# Patient Record
Sex: Male | Born: 1970 | Race: White | Hispanic: No | Marital: Married | Smoking: Never smoker
Health system: Southern US, Community
[De-identification: ages and names within clinical notes are randomized; demographics above are authoritative.]

## PROBLEM LIST (undated history)

## (undated) DIAGNOSIS — M1990 Unspecified osteoarthritis, unspecified site: Secondary | ICD-10-CM

## (undated) DIAGNOSIS — G43909 Migraine, unspecified, not intractable, without status migrainosus: Secondary | ICD-10-CM

## (undated) DIAGNOSIS — Z86018 Personal history of other benign neoplasm: Principal | ICD-10-CM

## (undated) DIAGNOSIS — I10 Essential (primary) hypertension: Secondary | ICD-10-CM

## (undated) DIAGNOSIS — K219 Gastro-esophageal reflux disease without esophagitis: Secondary | ICD-10-CM

## (undated) DIAGNOSIS — L409 Psoriasis, unspecified: Secondary | ICD-10-CM

## (undated) DIAGNOSIS — G4730 Sleep apnea, unspecified: Secondary | ICD-10-CM

## (undated) DIAGNOSIS — E119 Type 2 diabetes mellitus without complications: Secondary | ICD-10-CM

## (undated) DIAGNOSIS — G709 Myoneural disorder, unspecified: Secondary | ICD-10-CM

## (undated) HISTORY — PX: FOOT SURGERY: SHX648

## (undated) HISTORY — PX: WISDOM TOOTH EXTRACTION: SHX21

## (undated) HISTORY — PX: TONSILLECTOMY: SUR1361

## (undated) HISTORY — DX: Personal history of other benign neoplasm: Z86.018

---

## 2007-04-09 ENCOUNTER — Ambulatory Visit: Payer: Self-pay | Admitting: Family Medicine

## 2011-03-15 DIAGNOSIS — K219 Gastro-esophageal reflux disease without esophagitis: Secondary | ICD-10-CM | POA: Insufficient documentation

## 2011-03-15 DIAGNOSIS — J302 Other seasonal allergic rhinitis: Secondary | ICD-10-CM | POA: Insufficient documentation

## 2011-03-15 DIAGNOSIS — N200 Calculus of kidney: Secondary | ICD-10-CM | POA: Insufficient documentation

## 2011-03-15 DIAGNOSIS — R011 Cardiac murmur, unspecified: Secondary | ICD-10-CM | POA: Insufficient documentation

## 2011-03-15 DIAGNOSIS — E669 Obesity, unspecified: Secondary | ICD-10-CM | POA: Insufficient documentation

## 2011-03-15 DIAGNOSIS — G43909 Migraine, unspecified, not intractable, without status migrainosus: Secondary | ICD-10-CM | POA: Insufficient documentation

## 2011-05-25 DIAGNOSIS — G4733 Obstructive sleep apnea (adult) (pediatric): Secondary | ICD-10-CM | POA: Insufficient documentation

## 2011-05-25 DIAGNOSIS — R7303 Prediabetes: Secondary | ICD-10-CM | POA: Insufficient documentation

## 2013-03-20 ENCOUNTER — Emergency Department: Payer: Self-pay | Admitting: Emergency Medicine

## 2013-03-20 LAB — COMPREHENSIVE METABOLIC PANEL
Albumin: 4 g/dL (ref 3.4–5.0)
Alkaline Phosphatase: 99 U/L
Anion Gap: 1 — ABNORMAL LOW (ref 7–16)
BUN: 21 mg/dL — ABNORMAL HIGH (ref 7–18)
Bilirubin,Total: 0.3 mg/dL (ref 0.2–1.0)
CO2: 30 mmol/L (ref 21–32)
CREATININE: 1 mg/dL (ref 0.60–1.30)
Calcium, Total: 9.2 mg/dL (ref 8.5–10.1)
Chloride: 107 mmol/L (ref 98–107)
EGFR (African American): >60
Glucose: 94 mg/dL (ref 65–99)
Osmolality: 278 (ref 275–301)
Potassium: 3.8 mmol/L (ref 3.5–5.1)
SGOT(AST): 31 U/L (ref 15–37)
SGPT (ALT): 60 U/L (ref 12–78)
Sodium: 138 mmol/L (ref 136–145)
TOTAL PROTEIN: 7.8 g/dL (ref 6.4–8.2)

## 2013-03-20 LAB — CBC
HCT: 47.3 % (ref 40.0–52.0)
HGB: 15.9 g/dL (ref 13.0–18.0)
MCH: 29.6 pg (ref 26.0–34.0)
MCHC: 33.5 g/dL (ref 32.0–36.0)
MCV: 88 fL (ref 80–100)
Platelet: 270 10*3/uL (ref 150–440)
RBC: 5.35 10*6/uL (ref 4.40–5.90)
RDW: 13 % (ref 11.5–14.5)
WBC: 9.2 10*3/uL (ref 3.8–10.6)

## 2013-03-20 LAB — URINALYSIS, COMPLETE
BLOOD: NEGATIVE
Bacteria: NONE SEEN
Bilirubin,UR: NEGATIVE
GLUCOSE, UR: NEGATIVE mg/dL (ref 0–75)
Ketone: NEGATIVE
NITRITE: NEGATIVE
PH: 5 (ref 4.5–8.0)
PROTEIN: NEGATIVE
Specific Gravity: 1.025 (ref 1.003–1.030)

## 2013-10-25 ENCOUNTER — Encounter: Payer: Self-pay | Admitting: Podiatry

## 2013-10-25 ENCOUNTER — Ambulatory Visit: Payer: BC Managed Care – PPO | Admitting: Podiatry

## 2013-10-25 ENCOUNTER — Ambulatory Visit (INDEPENDENT_AMBULATORY_CARE_PROVIDER_SITE_OTHER): Payer: BC Managed Care – PPO | Admitting: Podiatry

## 2013-10-25 ENCOUNTER — Other Ambulatory Visit: Payer: Self-pay | Admitting: *Deleted

## 2013-10-25 ENCOUNTER — Ambulatory Visit (INDEPENDENT_AMBULATORY_CARE_PROVIDER_SITE_OTHER): Payer: BC Managed Care – PPO

## 2013-10-25 ENCOUNTER — Encounter: Payer: Self-pay | Admitting: *Deleted

## 2013-10-25 VITALS — BP 137/78 | HR 50 | Resp 16

## 2013-10-25 DIAGNOSIS — M779 Enthesopathy, unspecified: Secondary | ICD-10-CM

## 2013-10-25 MED ORDER — TRIAMCINOLONE ACETONIDE 10 MG/ML IJ SUSP
10.0000 mg | Freq: Once | INTRAMUSCULAR | Status: DC
Start: 2013-10-25 — End: 2023-11-17

## 2013-10-25 NOTE — Progress Notes (Signed)
Subjective:     Patient ID: Keith Osborne, male   DOB: Jan 16, 1971, 43 y.o.   MRN: [id number]  HPI patient states that I get some low grade pain in the dorsal lateral aspect of my left foot and the boot seem to help me that I wear based on your notes   Review of Systems     Objective:   Physical Exam Neurovascular status intact with muscle strength adequate and found to have discomfort dorsal lateral aspect left foot of a mild nature with no current heel pain noted left    Assessment:     Moderate tendinitis-like symptoms dorsal lateral left foot with improvement significant from endoscopic fascial surgery    Plan:     H&P and x-rays reviewed. Injected the dorsal lateral foot 3 mg Kenalog 5 mg Xylocaine and instructed on physical therapy and reappoint as needed

## 2013-11-05 ENCOUNTER — Ambulatory Visit: Payer: Self-pay | Admitting: Podiatry

## 2014-08-29 ENCOUNTER — Other Ambulatory Visit: Payer: Self-pay | Admitting: Podiatry

## 2014-08-29 DIAGNOSIS — M79672 Pain in left foot: Secondary | ICD-10-CM

## 2014-09-04 ENCOUNTER — Ambulatory Visit
Admission: RE | Admit: 2014-09-04 | Discharge: 2014-09-04 | Disposition: A | Discharge status: Home / Self Care | Payer: BC Managed Care – PPO | Source: Ambulatory Visit | Attending: Podiatry | Admitting: Podiatry

## 2014-09-04 DIAGNOSIS — R2242 Localized swelling, mass and lump, left lower limb: Secondary | ICD-10-CM | POA: Insufficient documentation

## 2014-09-04 DIAGNOSIS — M79672 Pain in left foot: Secondary | ICD-10-CM | POA: Insufficient documentation

## 2015-01-08 IMAGING — CT CT ABD-PELV W/ CM
2 of 5 series · 17 of 46 positions shown, 19 images · IV contrast (isovue)
Comparison: None.

CLINICAL DATA: Right flank pain

EXAM:
CT ABDOMEN AND PELVIS WITH CONTRAST
TECHNIQUE: Multidetector CT imaging of the abdomen and pelvis was performed
using the standard protocol following bolus administration of
intravenous contrast.
CONTRAST:  125 mL Isovue 370 IV

[Series 2: routine abd pel with · axial · 0.91mm/px · z∈[-1200,-774]mm · 14 of 95 slices shown, 16 images]
[im 5/95  soft-tissue]
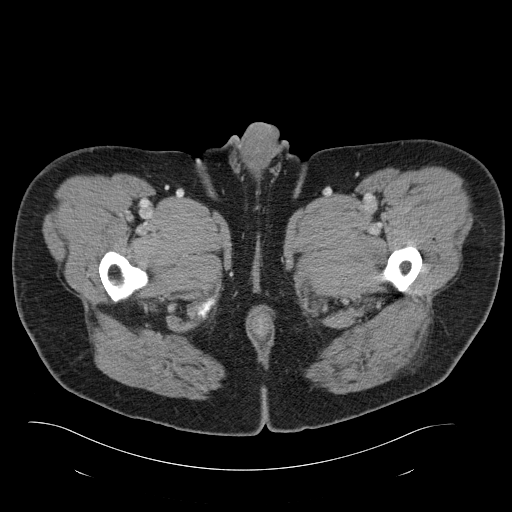
[im 5/95  bone]
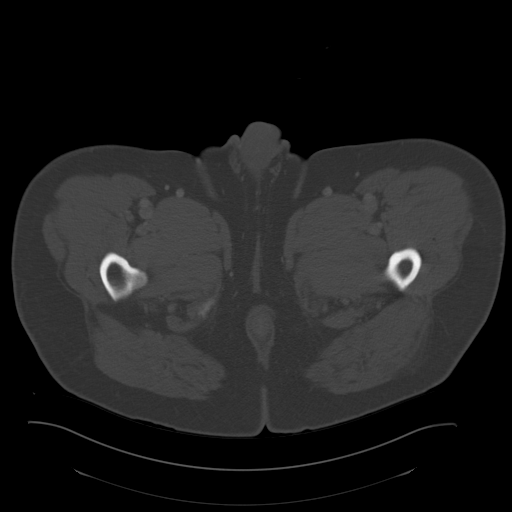
[im 10/95  soft-tissue]
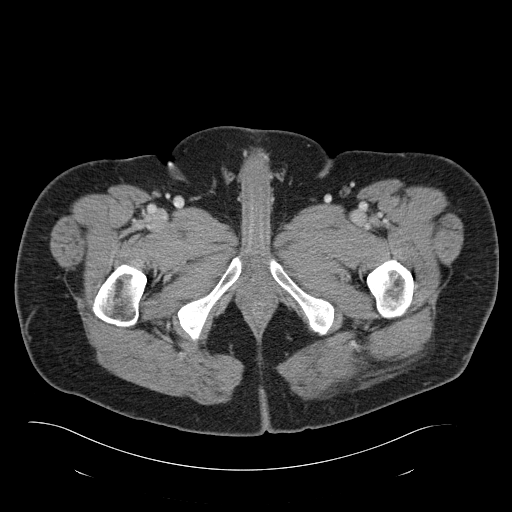
[im 20/95  soft-tissue]
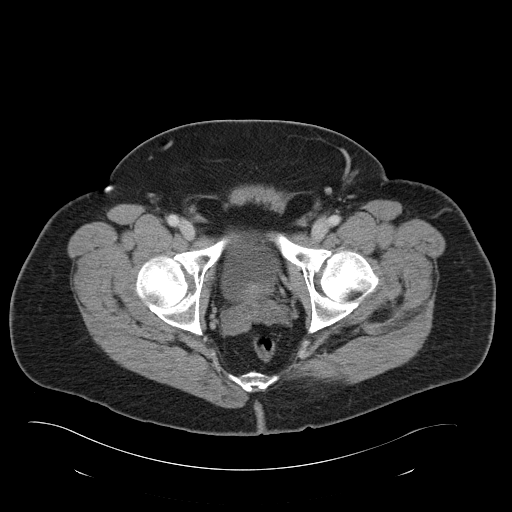
[im 25/95  soft-tissue]
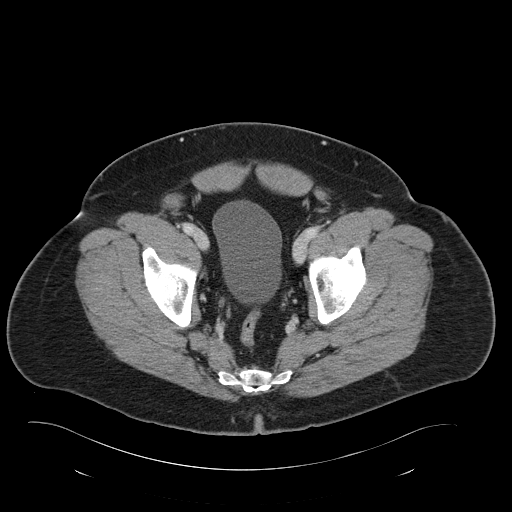
[im 30/95  soft-tissue]
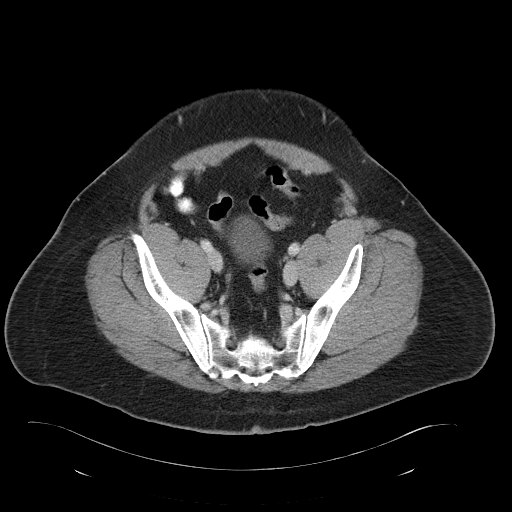
[im 40/95  soft-tissue]
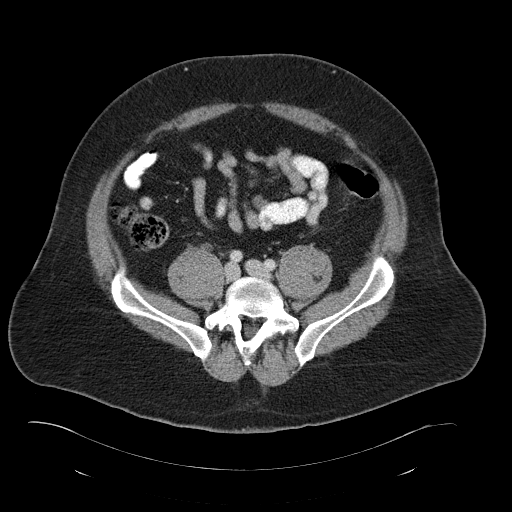
[im 45/95  soft-tissue]
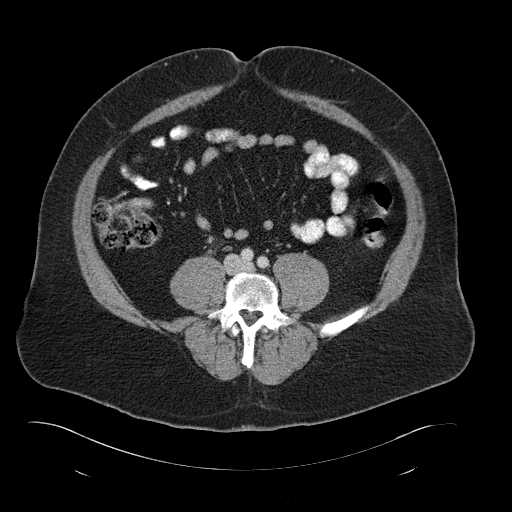
[im 50/95  soft-tissue]
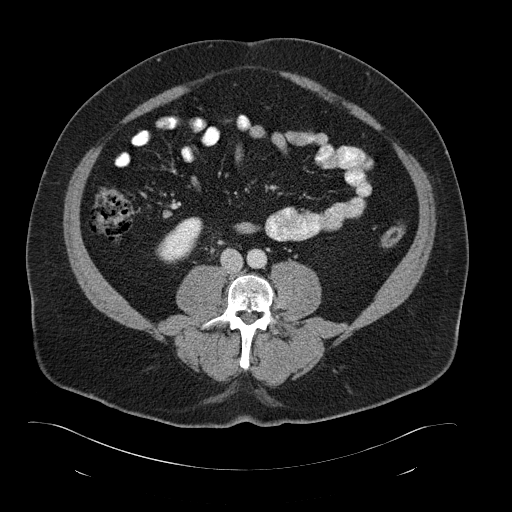
[im 55/95  soft-tissue]
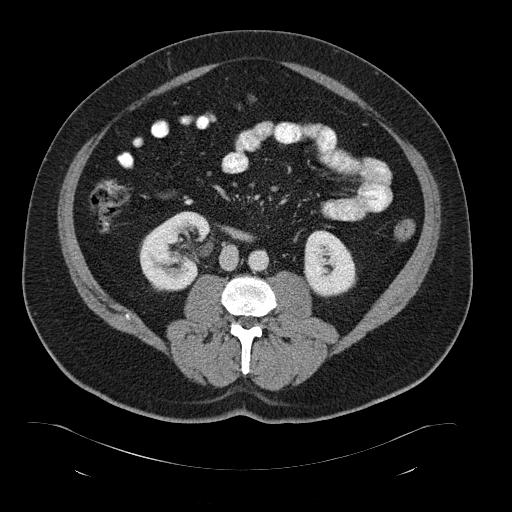
[im 55/95  bone]
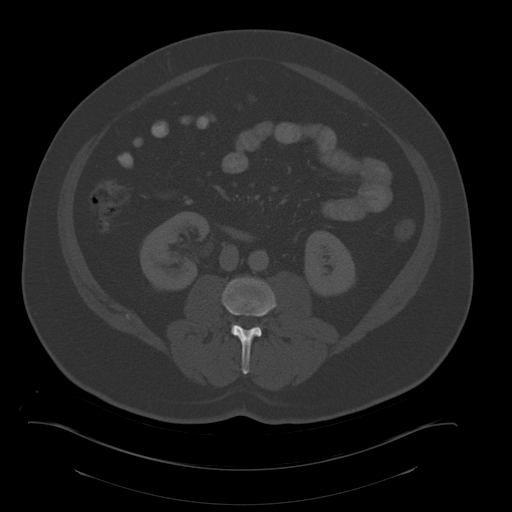
[im 65/95  soft-tissue]
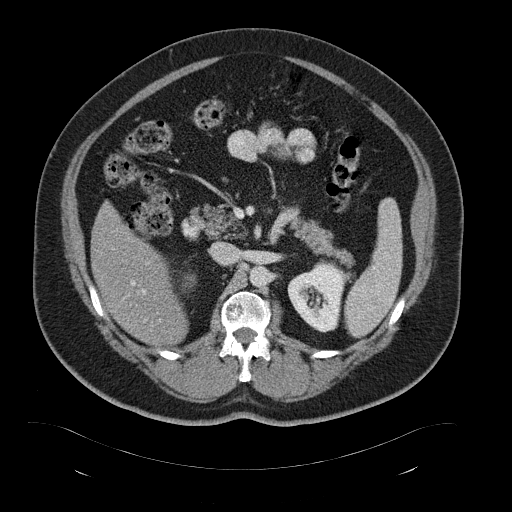
[im 70/95  soft-tissue]
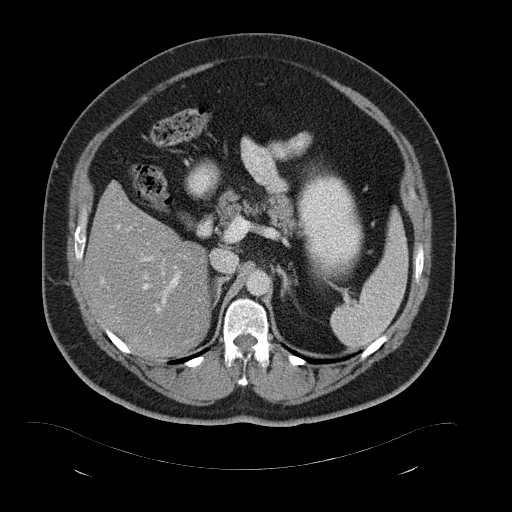
[im 75/95  soft-tissue]
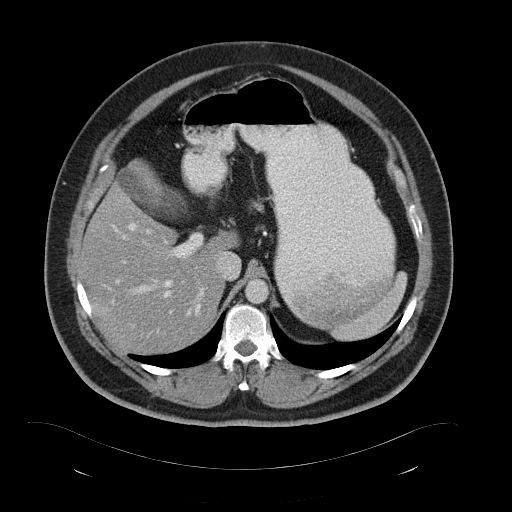
[im 85/95  soft-tissue]
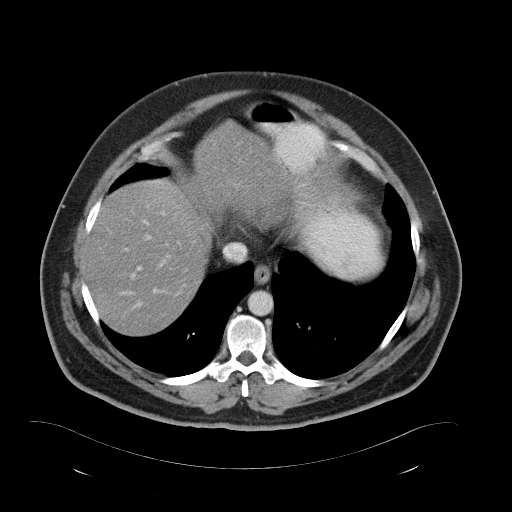
[im 90/95  soft-tissue]
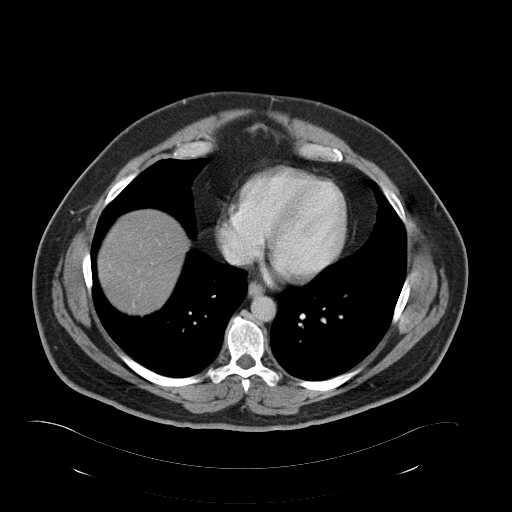

[Series 5: cor routine abd pel with · coronal · 0.91mm/px · 3 of 199 slices shown]
[im 67/199  soft-tissue]
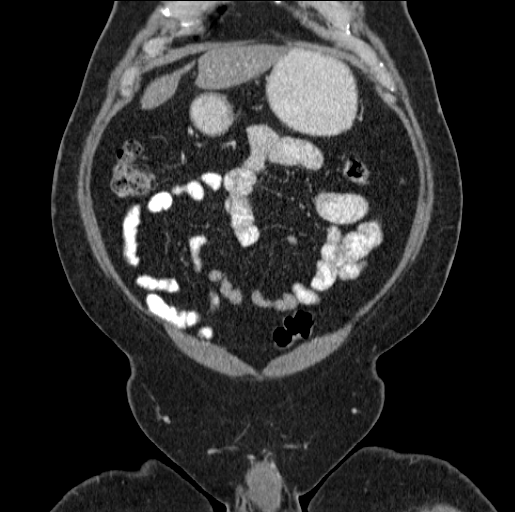
[im 89/199  soft-tissue]
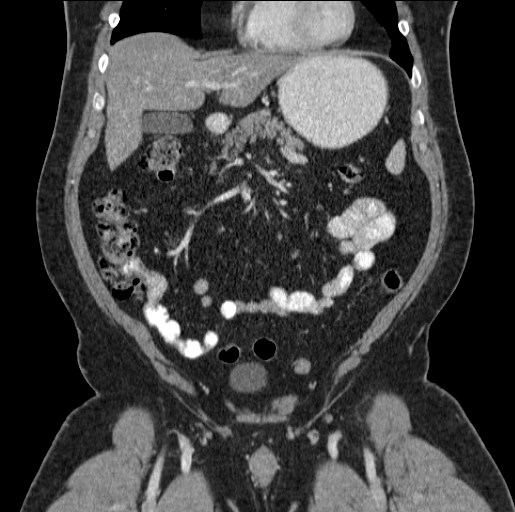
[im 111/199  soft-tissue]
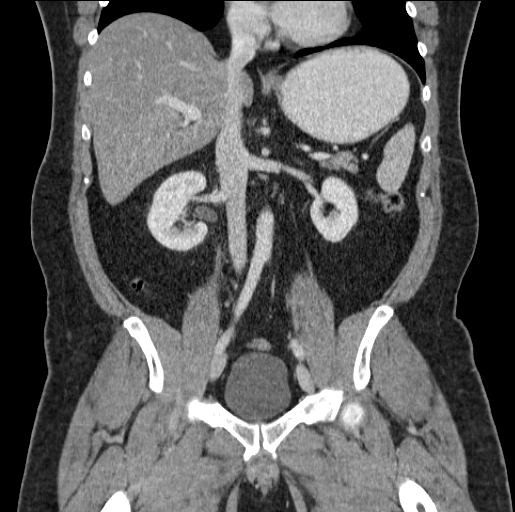

[17 of 46 positions shown; findings below may reference images not displayed]

FINDINGS: Visualized lung bases clear. Fatty liver without focal lesion.
Unremarkable nondistended gallbladder, spleen, adrenal glands,
kidneys, pancreas, aorta, portal vein, stomach, small bowel,
appendix, colon. No nephrolithiasis. Urinary bladder physiologically
distended. Moderate prostatic enlargement. No ascites. No free air.
No adenopathy. No hydronephrosis. Degenerative disc disease L4-5.
IMPRESSION: 1. No acute abdominal process.  Normal appendix.
2. Fatty liver

## 2016-01-21 ENCOUNTER — Encounter: Payer: Self-pay | Admitting: Emergency Medicine

## 2016-01-21 ENCOUNTER — Emergency Department
Admission: EM | Admit: 2016-01-21 | Discharge: 2016-01-21 | Disposition: A | Discharge status: Home / Self Care | Payer: BC Managed Care – PPO | Attending: Emergency Medicine | Admitting: Emergency Medicine

## 2016-01-21 DIAGNOSIS — Z7982 Long term (current) use of aspirin: Secondary | ICD-10-CM | POA: Diagnosis not present

## 2016-01-21 DIAGNOSIS — N200 Calculus of kidney: Secondary | ICD-10-CM

## 2016-01-21 DIAGNOSIS — Z79899 Other long term (current) drug therapy: Secondary | ICD-10-CM | POA: Insufficient documentation

## 2016-01-21 DIAGNOSIS — R109 Unspecified abdominal pain: Secondary | ICD-10-CM

## 2016-01-21 DIAGNOSIS — R1030 Lower abdominal pain, unspecified: Secondary | ICD-10-CM | POA: Diagnosis present

## 2016-01-21 HISTORY — DX: Migraine, unspecified, not intractable, without status migrainosus: G43.909

## 2016-01-21 HISTORY — DX: Gastro-esophageal reflux disease without esophagitis: K21.9

## 2016-01-21 LAB — BASIC METABOLIC PANEL
Anion gap: 8 (ref 5–15)
BUN: 21 mg/dL — AB (ref 6–20)
CHLORIDE: 106 mmol/L (ref 101–111)
CO2: 25 mmol/L (ref 22–32)
CREATININE: 1.03 mg/dL (ref 0.61–1.24)
Calcium: 9.4 mg/dL (ref 8.9–10.3)
GFR calc Af Amer: >60 mL/min (ref >60)
GFR calc non Af Amer: >60 mL/min (ref >60)
GLUCOSE: 107 mg/dL — AB (ref 65–99)
POTASSIUM: 3.9 mmol/L (ref 3.5–5.1)
Sodium: 139 mmol/L (ref 135–145)

## 2016-01-21 LAB — CBC
HCT: 47.1 % (ref 40.0–52.0)
Hemoglobin: 16.2 g/dL (ref 13.0–18.0)
MCH: 29.7 pg (ref 26.0–34.0)
MCHC: 34.4 g/dL (ref 32.0–36.0)
MCV: 86.2 fL (ref 80.0–100.0)
Platelets: 237 10*3/uL (ref 150–440)
RBC: 5.47 MIL/uL (ref 4.40–5.90)
RDW: 13.3 % (ref 11.5–14.5)
WBC: 7.8 10*3/uL (ref 3.8–10.6)

## 2016-01-21 LAB — URINALYSIS, COMPLETE (UACMP) WITH MICROSCOPIC
BILIRUBIN URINE: NEGATIVE
Bacteria, UA: NONE SEEN
GLUCOSE, UA: NEGATIVE mg/dL
Ketones, ur: NEGATIVE mg/dL
LEUKOCYTES UA: NEGATIVE
Nitrite: NEGATIVE
PH: 6 (ref 5.0–8.0)
Protein, ur: 30 mg/dL — AB
SPECIFIC GRAVITY, URINE: 1.026 (ref 1.005–1.030)

## 2016-01-21 MED ORDER — ONDANSETRON HCL 4 MG/2ML IJ SOLN
4.0000 mg | Freq: Once | INTRAMUSCULAR | Status: AC
  Administered 2016-01-21: 4 mg via INTRAVENOUS
  Filled 2016-01-21: qty 2

## 2016-01-21 MED ORDER — SODIUM CHLORIDE 0.9 % IV BOLUS (SEPSIS)
1000.0000 mL | Freq: Once | INTRAVENOUS | Status: AC
  Administered 2016-01-21: 1000 mL via INTRAVENOUS

## 2016-01-21 MED ORDER — OXYCODONE-ACETAMINOPHEN 5-325 MG PO TABS
ORAL_TABLET | ORAL | Status: AC
  Filled 2016-01-21: qty 1

## 2016-01-21 MED ORDER — KETOROLAC TROMETHAMINE 30 MG/ML IJ SOLN
30.0000 mg | Freq: Once | INTRAMUSCULAR | Status: AC
  Administered 2016-01-21: 30 mg via INTRAVENOUS
  Filled 2016-01-21: qty 1

## 2016-01-21 MED ORDER — TAMSULOSIN HCL 0.4 MG PO CAPS: 0.4000 mg | ORAL_CAPSULE | Freq: Every day | ORAL | 0 refills | Status: AC

## 2016-01-21 MED ORDER — MORPHINE SULFATE (PF) 4 MG/ML IV SOLN
4.0000 mg | Freq: Once | INTRAVENOUS | Status: AC
  Administered 2016-01-21: 4 mg via INTRAVENOUS
  Filled 2016-01-21: qty 1

## 2016-01-21 MED ORDER — OXYCODONE-ACETAMINOPHEN 5-325 MG PO TABS
1.0000 | ORAL_TABLET | Freq: Once | ORAL | Status: AC
  Administered 2016-01-21: 1 via ORAL
  Filled 2016-01-21: qty 1

## 2016-01-21 MED ORDER — IBUPROFEN 200 MG PO TABS: 600.0000 mg | ORAL_TABLET | Freq: Four times a day (QID) | ORAL | 0 refills | Status: DC | PRN

## 2016-01-21 MED ORDER — ONDANSETRON HCL 4 MG PO TABS: 4.0000 mg | ORAL_TABLET | Freq: Three times a day (TID) | ORAL | 0 refills | Status: DC | PRN

## 2016-01-21 MED ORDER — OXYCODONE-ACETAMINOPHEN 5-325 MG PO TABS: 1.0000 | ORAL_TABLET | Freq: Four times a day (QID) | ORAL | 0 refills | Status: DC | PRN

## 2016-01-21 MED ORDER — OXYCODONE-ACETAMINOPHEN 5-325 MG PO TABS
1.0000 | ORAL_TABLET | Freq: Once | ORAL | Status: AC
  Administered 2016-01-21: 1 via ORAL

## 2016-01-21 NOTE — Discharge Instructions (Signed)
At this time, as we discussed, kidney stones the most likely diagnosis however, we cannot prove that without a CT scan. However, CT scan does involve a lot of radiation as we discussed.. If you have fever, increased pain, numbness, weakness, vomiting, or any other concerning symptoms return to the emergency room. Follow closely with her primary care doctor and her urologist.

## 2016-01-21 NOTE — ED Triage Notes (Signed)
Pt to ED c/o left flank pain and groin pain this morning.  States difficulty urinating, nausea and vomiting x2.  Hx of kidney stones and feels similar.

## 2016-01-21 NOTE — ED Provider Notes (Addendum)
[facility] Emergency Department Provider Note  ____________________________________________   I have reviewed the triage vital signs and the nursing notes.   HISTORY  Chief Complaint Flank Pain and Groin Pain    HPI Keith Osborne is a 45 y.o. male who presents today complaining of a kidney stone. Patient has had multiple different kidney stones in the past. He had sudden onset right flank pain radiating to the groin started this morning. Very similar to prior kidney stones. Has had at least one CT scan past for these. There had a complications that he recalls. States that he would prefer to avoid CT if possible. He'll like to have the pain reduced. Pain is sharp and colicky, goes from the left flank.     Past Medical History:  Diagnosis Date  . Acid reflux   . Migraines     Patient Active Problem List   Diagnosis Date Noted  . Obstructive apnea 05/25/2011  . Borderline diabetes 05/25/2011  . Acid reflux 03/15/2011  . Cardiac murmur 03/15/2011  . Headache, migraine 03/15/2011  . Calculus of kidney 03/15/2011  . Adiposity 03/15/2011  . Allergic rhinitis, seasonal 03/15/2011    Past Surgical History:  Procedure Laterality Date  . FOOT SURGERY Left   . WISDOM TOOTH EXTRACTION      Prior to Admission medications   Medication Sig Start Date End Date Taking? Authorizing Provider  aspirin-acetaminophen-caffeine (MIGRAINE FORMULA) [phone] MG per tablet As directed by package.    Historical Provider, MD  cetirizine-pseudoephedrine (ZYRTEC-D) 5-120 MG per tablet Take by mouth.    Historical Provider, MD  esomeprazole (NEXIUM) 40 MG capsule Take 40 mg by mouth daily at 12 noon.    Historical Provider, MD  propranolol (INDERAL) 40 MG tablet Take by mouth. 05/01/13   Historical Provider, MD    Allergies Cyclobenzaprine  History reviewed. No pertinent family history.  Social History Social History  Substance Use Topics  . Smoking status:  Never Smoker  . Smokeless tobacco: Never Used  . Alcohol use Yes     Comment: occ.    Review of Systems Constitutional: No fever/chills Eyes: No visual changes. ENT: No sore throat. No stiff neck no neck pain Cardiovascular: Denies chest pain. Respiratory: Denies shortness of breath. Gastrointestinal:   no vomiting.  No diarrhea.  No constipation. Genitourinary: Negative for dysuria. Musculoskeletal: Negative lower extremity swelling Skin: Negative for rash. Neurological: Negative for severe headaches, focal weakness or numbness. 10-point ROS otherwise negative.  ____________________________________________   PHYSICAL EXAM:  VITAL SIGNS: ED Triage Vitals  Enc Vitals Group     BP 01/21/16 1135 (!) 158/87     Pulse Rate 01/21/16 1135 (!) 52     Resp 01/21/16 1135 18     Temp 01/21/16 1135 97.5 F (36.4 C)     Temp Source 01/21/16 1135 Oral     SpO2 01/21/16 1135 100 %     Weight 01/21/16 1135 245 lb (111.1 kg)     Height 01/21/16 1135 5\' 5"  (1.651 m)     Head Circumference --      Peak Flow --      Pain Score 01/21/16 1136 10     Pain Loc --      Pain Edu? --      Excl. in [place]? --     Constitutional: Alert and oriented. Well appearing and in no acute distress. Eyes: Conjunctivae are normal. PERRL. EOMI. Head: Atraumatic. Nose: No congestion/rhinnorhea. Mouth/Throat: Mucous membranes are moist.  Oropharynx non-erythematous. Neck: No stridor.   Nontender with no meningismus Cardiovascular: Normal rate, regular rhythm. Grossly normal heart sounds.  Good peripheral circulation. Respiratory: Normal respiratory effort.  No retractions. Lungs CTAB. Abdominal: Soft and nontender. No distention. No guarding no rebound GU normal male genitalia no tenderness no mass, no lesions noted Back:  There is no focal tenderness or step off.  there is no midline tenderness there are no lesions noted. there is Left CVA tenderness Musculoskeletal: No lower extremity tenderness, no upper  extremity tenderness. No joint effusions, no DVT signs strong distal pulses no edema Neurologic:  Normal speech and language. No gross focal neurologic deficits are appreciated.  Skin:  Skin is warm, dry and intact. No rash noted. Psychiatric: Mood and affect are normal. Speech and behavior are normal.  ____________________________________________   LABS (all labs ordered are listed, but only abnormal results are displayed)  Labs Reviewed  URINALYSIS, COMPLETE (UACMP) WITH MICROSCOPIC - Abnormal; Notable for the following:       Result Value   Color, Urine YELLOW (*)    APPearance CLEAR (*)    Hgb urine dipstick LARGE (*)    Protein, ur 30 (*)    Squamous Epithelial / LPF 0-5 (*)    All other components within normal limits  BASIC METABOLIC PANEL - Abnormal; Notable for the following:    Glucose, Bld 107 (*)    BUN 21 (*)    All other components within normal limits  CBC   ____________________________________________  EKG  I personally interpreted any EKGs ordered by me or triage  ____________________________________________  RADIOLOGY  I reviewed any imaging ordered by me or triage that were performed during my shift and, if possible, patient and/or family made aware of any abnormal findings. ____________________________________________   PROCEDURES  Procedure(s) performed: None  Procedures  Critical Care performed: None  ____________________________________________   INITIAL IMPRESSION / ASSESSMENT AND PLAN / ED COURSE  Pertinent labs & imaging results that were available during my care of the patient were reviewed by me and considered in my medical decision making (see chart for details).  Appearing gentleman with a history of kidney stones presents today with what appears to be a kidney stone with hematuria, sudden onset flank pain. Do not at this time suspect AAA or other intra-abdominal pathology of any significance. Patient understands that CT scan, there  is a limited to what I can evaluate him for, however, we are both very reassured by the history and our findings. Patient would therefore prefer to decline CT scan. He knows it is available to him if he changes his mind. He was as risk benefits alternatives of declining ECT. Accordingly, we will treat the patient with appropriate analgesia and antiemetics, if we can get his pain better, is my hope that we get him safely home with close outpatient follow-up with urology. Return precautions and follow-up given and understood.  ----------------------------------------- 2:59 PM on 01/21/2016 -----------------------------------------  Patient pain-free able to urinate, declines further workup eager to go home. Has a urologist. Burnis Medin follow-up. Return precautions and follow-up given and understood. Abdomen remains completely benign there is nothing this time to suggest AAA, diverticulitis, appendicitis etc. Patient also does not have any evidence to support the idea that this is a referred intrathoracic pathology.  Clinical Course    ____________________________________________   FINAL CLINICAL IMPRESSION(S) / ED DIAGNOSES  Final diagnoses:  None      This chart was dictated using voice recognition software.  Despite best efforts  to proofread,  errors can occur which can change meaning.      Schuyler Amor, MD 01/21/16 1241    Schuyler Amor, MD 01/21/16 [place], MD 01/21/16 [phone]

## 2016-05-25 DIAGNOSIS — Z86018 Personal history of other benign neoplasm: Secondary | ICD-10-CM

## 2016-05-25 HISTORY — DX: Personal history of other benign neoplasm: Z86.018

## 2018-02-20 ENCOUNTER — Other Ambulatory Visit: Payer: Self-pay | Admitting: Podiatry

## 2018-03-05 NOTE — Anesthesia Preprocedure Evaluation (Addendum)
Anesthesia Evaluation  Patient identified by MRN, date of birth, ID band Patient awake    Reviewed: Allergy & Precautions, NPO status , Patient's Chart, lab work & pertinent test results  History of Anesthesia Complications Negative for: history of anesthetic complications  Airway Mallampati: IV   Neck ROM: Full    Dental no notable dental hx.    Pulmonary sleep apnea and Continuous Positive Airway Pressure Ventilation ,    Pulmonary exam normal breath sounds clear to auscultation       Cardiovascular hypertension, Normal cardiovascular exam Rhythm:Regular Rate:Normal  Stress echo 12/26/17:  Normal Stress Echocardiogram VERY MILD TR NORAML ESTIMATED PA PRESSURE NORMAL RESTING LV AND RV FUNCTION VALVES NORMAL NO PERICARDIAL EFFUSION   Neuro/Psych  Headaches,    GI/Hepatic GERD  ,  Endo/Other  Obesity   Renal/GU Renal disease (hx nephrolithiasis)     Musculoskeletal   Abdominal   Peds  Hematology negative hematology ROS (+)   Anesthesia Other Findings   Reproductive/Obstetrics                            Anesthesia Physical Anesthesia Plan  ASA: III  Anesthesia Plan: General   Post-op Pain Management:    Induction: Intravenous  PONV Risk Score and Plan: 2 and Dexamethasone and Ondansetron  Airway Management Planned: LMA  Additional Equipment:   Intra-op Plan:   Post-operative Plan: Extubation in OR  Informed Consent: I have reviewed the patients History and Physical, chart, labs and discussed the procedure including the risks, benefits and alternatives for the proposed anesthesia with the patient or authorized representative who has indicated his/her understanding and acceptance.       Plan Discussed with: CRNA  Anesthesia Plan Comments:        Anesthesia Quick Evaluation

## 2018-03-07 ENCOUNTER — Ambulatory Visit: Payer: BC Managed Care – PPO | Admitting: Anesthesiology

## 2018-03-07 ENCOUNTER — Ambulatory Visit
Admission: RE | Admit: 2018-03-07 | Discharge: 2018-03-07 | Disposition: A | Discharge status: Home / Self Care | Payer: BC Managed Care – PPO | Attending: Podiatry | Admitting: Podiatry

## 2018-03-07 ENCOUNTER — Encounter
Admission: RE | Disposition: A | Discharge status: Home / Self Care | Payer: Self-pay | Source: Home / Self Care | Attending: Podiatry

## 2018-03-07 DIAGNOSIS — Z79899 Other long term (current) drug therapy: Secondary | ICD-10-CM | POA: Insufficient documentation

## 2018-03-07 DIAGNOSIS — I10 Essential (primary) hypertension: Secondary | ICD-10-CM | POA: Diagnosis not present

## 2018-03-07 DIAGNOSIS — M722 Plantar fascial fibromatosis: Secondary | ICD-10-CM | POA: Diagnosis not present

## 2018-03-07 DIAGNOSIS — Z6841 Body Mass Index (BMI) 40.0 and over, adult: Secondary | ICD-10-CM | POA: Insufficient documentation

## 2018-03-07 DIAGNOSIS — G43909 Migraine, unspecified, not intractable, without status migrainosus: Secondary | ICD-10-CM | POA: Diagnosis not present

## 2018-03-07 DIAGNOSIS — F419 Anxiety disorder, unspecified: Secondary | ICD-10-CM | POA: Insufficient documentation

## 2018-03-07 DIAGNOSIS — Z791 Long term (current) use of non-steroidal anti-inflammatories (NSAID): Secondary | ICD-10-CM | POA: Diagnosis not present

## 2018-03-07 DIAGNOSIS — K219 Gastro-esophageal reflux disease without esophagitis: Secondary | ICD-10-CM | POA: Insufficient documentation

## 2018-03-07 DIAGNOSIS — G5751 Tarsal tunnel syndrome, right lower limb: Secondary | ICD-10-CM | POA: Insufficient documentation

## 2018-03-07 DIAGNOSIS — G4733 Obstructive sleep apnea (adult) (pediatric): Secondary | ICD-10-CM | POA: Diagnosis not present

## 2018-03-07 DIAGNOSIS — E669 Obesity, unspecified: Secondary | ICD-10-CM | POA: Diagnosis not present

## 2018-03-07 DIAGNOSIS — Z888 Allergy status to other drugs, medicaments and biological substances status: Secondary | ICD-10-CM | POA: Insufficient documentation

## 2018-03-07 DIAGNOSIS — R7303 Prediabetes: Secondary | ICD-10-CM | POA: Insufficient documentation

## 2018-03-07 DIAGNOSIS — Z7982 Long term (current) use of aspirin: Secondary | ICD-10-CM | POA: Diagnosis not present

## 2018-03-07 HISTORY — DX: Essential (primary) hypertension: I10

## 2018-03-07 HISTORY — DX: Sleep apnea, unspecified: G47.30

## 2018-03-07 HISTORY — PX: NERVE REPAIR: SHX2083

## 2018-03-07 HISTORY — PX: PLANTAR FASCIA RELEASE: SHX2239

## 2018-03-07 SURGERY — RELEASE, FASCIA, PLANTAR
Anesthesia: General | Site: Foot | Laterality: Right

## 2018-03-07 MED ORDER — OXYCODONE-ACETAMINOPHEN 5-325 MG PO TABS: 1.0000 | ORAL_TABLET | Freq: Four times a day (QID) | ORAL | 0 refills | Status: DC | PRN

## 2018-03-07 MED ORDER — ONDANSETRON HCL 4 MG/2ML IJ SOLN
INTRAMUSCULAR | Status: DC | PRN
  Administered 2018-03-07: 4 mg via INTRAVENOUS

## 2018-03-07 MED ORDER — ONDANSETRON HCL 4 MG PO TABS: 4.0000 mg | ORAL_TABLET | Freq: Three times a day (TID) | ORAL | 1 refills | Status: AC | PRN

## 2018-03-07 MED ORDER — LIDOCAINE HCL (CARDIAC) PF 100 MG/5ML IV SOSY
PREFILLED_SYRINGE | INTRAVENOUS | Status: DC | PRN
  Administered 2018-03-07: 40 mg via INTRATRACHEAL

## 2018-03-07 MED ORDER — BUPIVACAINE LIPOSOME 1.3 % IJ SUSP
INTRAMUSCULAR | Status: DC | PRN
  Administered 2018-03-07 (×2): 10 mL

## 2018-03-07 MED ORDER — FENTANYL CITRATE (PF) 100 MCG/2ML IJ SOLN
INTRAMUSCULAR | Status: DC | PRN
  Administered 2018-03-07: 25 ug via INTRAVENOUS
  Administered 2018-03-07: 50 ug via INTRAVENOUS
  Administered 2018-03-07: 25 ug via INTRAVENOUS

## 2018-03-07 MED ORDER — LACTATED RINGERS IV SOLN: INTRAVENOUS | Status: DC

## 2018-03-07 MED ORDER — BUPIVACAINE-EPINEPHRINE (PF) 0.25% -1:200000 IJ SOLN
INTRAMUSCULAR | Status: DC | PRN
  Administered 2018-03-07: 10 mL

## 2018-03-07 MED ORDER — ACETAMINOPHEN 10 MG/ML IV SOLN: 1000.0000 mg | Freq: Once | INTRAVENOUS | Status: DC | PRN

## 2018-03-07 MED ORDER — POVIDONE-IODINE 7.5 % EX SOLN: Freq: Once | CUTANEOUS | Status: DC

## 2018-03-07 MED ORDER — OXYCODONE HCL 5 MG/5ML PO SOLN: 5.0000 mg | Freq: Once | ORAL | Status: DC | PRN

## 2018-03-07 MED ORDER — GLYCOPYRROLATE 0.2 MG/ML IJ SOLN
INTRAMUSCULAR | Status: DC | PRN
  Administered 2018-03-07: 0.1 mg via INTRAVENOUS

## 2018-03-07 MED ORDER — OXYCODONE-ACETAMINOPHEN 5-325 MG PO TABS: 1.0000 | ORAL_TABLET | ORAL | 0 refills | Status: AC | PRN

## 2018-03-07 MED ORDER — OXYCODONE HCL 5 MG PO TABS: 5.0000 mg | ORAL_TABLET | Freq: Once | ORAL | Status: DC | PRN

## 2018-03-07 MED ORDER — FENTANYL CITRATE (PF) 100 MCG/2ML IJ SOLN: 25.0000 ug | INTRAMUSCULAR | Status: DC | PRN

## 2018-03-07 MED ORDER — ONDANSETRON HCL 4 MG/2ML IJ SOLN: 4.0000 mg | Freq: Four times a day (QID) | INTRAMUSCULAR | Status: DC | PRN

## 2018-03-07 MED ORDER — DEXAMETHASONE SODIUM PHOSPHATE 4 MG/ML IJ SOLN
INTRAMUSCULAR | Status: DC | PRN
  Administered 2018-03-07: 4 mg via INTRAVENOUS

## 2018-03-07 MED ORDER — ONDANSETRON HCL 4 MG/2ML IJ SOLN: 4.0000 mg | Freq: Once | INTRAMUSCULAR | Status: DC | PRN

## 2018-03-07 MED ORDER — CEFAZOLIN SODIUM-DEXTROSE 2-4 GM/100ML-% IV SOLN
2.0000 g | INTRAVENOUS | Status: AC
  Administered 2018-03-07: 2 g via INTRAVENOUS

## 2018-03-07 MED ORDER — PROPOFOL 10 MG/ML IV BOLUS
INTRAVENOUS | Status: DC | PRN
  Administered 2018-03-07: 150 mg via INTRAVENOUS

## 2018-03-07 MED ORDER — ONDANSETRON HCL 4 MG PO TABS: 4.0000 mg | ORAL_TABLET | Freq: Four times a day (QID) | ORAL | Status: DC | PRN

## 2018-03-07 MED ORDER — LACTATED RINGERS IV SOLN
INTRAVENOUS | Status: DC
  Administered 2018-03-07: 14:00:00 via INTRAVENOUS

## 2018-03-07 MED ORDER — MIDAZOLAM HCL 5 MG/5ML IJ SOLN
INTRAMUSCULAR | Status: DC | PRN
  Administered 2018-03-07: 2 mg via INTRAVENOUS

## 2018-03-07 SURGICAL SUPPLY — 47 items
APPLICATOR COTTON TIP 6IN STRL (MISCELLANEOUS) ×7 IMPLANT
BANDAGE ELASTIC 4 LF NS (GAUZE/BANDAGES/DRESSINGS) ×2 IMPLANT
BENZOIN TINCTURE PRP APPL 2/3 (GAUZE/BANDAGES/DRESSINGS) IMPLANT
BNDG COHESIVE 4X5 TAN STRL (GAUZE/BANDAGES/DRESSINGS) ×2 IMPLANT
BNDG ESMARK 4X12 TAN STRL LF (GAUZE/BANDAGES/DRESSINGS) ×2 IMPLANT
BNDG GAUZE 4.5X4.1 6PLY STRL (MISCELLANEOUS) ×2 IMPLANT
BNDG STRETCH 4X75 STRL LF (GAUZE/BANDAGES/DRESSINGS) ×2 IMPLANT
CANISTER SUCT 1200ML W/VALVE (MISCELLANEOUS) ×2 IMPLANT
COVER LIGHT HANDLE UNIVERSAL (MISCELLANEOUS) ×4 IMPLANT
DURAPREP 26ML APPLICATOR (WOUND CARE) ×2 IMPLANT
ELECT REM PT RETURN 9FT ADLT (ELECTROSURGICAL) ×2
ELECTRODE REM PT RTRN 9FT ADLT (ELECTROSURGICAL) ×1 IMPLANT
ENDOSCOPIC PLANTAR FASCIOTOMY (KITS) ×1 IMPLANT
GAUZE PETRO XEROFOAM 1X8 (MISCELLANEOUS) ×1 IMPLANT
GAUZE PETRO XEROFOAM 5X9 (MISCELLANEOUS) ×2 IMPLANT
GAUZE SPONGE 4X4 12PLY STRL (GAUZE/BANDAGES/DRESSINGS) IMPLANT
GLOVE BIO SURGEON STRL SZ7.5 (GLOVE) ×3 IMPLANT
GLOVE INDICATOR 8.0 STRL GRN (GLOVE) ×3 IMPLANT
GOWN STRL REUS W/ TWL LRG LVL3 (GOWN DISPOSABLE) ×2 IMPLANT
GOWN STRL REUS W/TWL LRG LVL3 (GOWN DISPOSABLE) ×2
IV NS 250ML (IV SOLUTION) ×1
IV NS 250ML BAXH (IV SOLUTION) ×1 IMPLANT
K-WIRE DBL END TROCAR 6X.062 (WIRE) ×2
KIT TURNOVER KIT A (KITS) ×2 IMPLANT
KWIRE DBL END TROCAR 6X.062 (WIRE) IMPLANT
NDL 18GX1X1/2 (RX/OR ONLY) (NEEDLE) IMPLANT
NDL HYPO 25GX1X1/2 BEV (NEEDLE) IMPLANT
NEEDLE 18GX1X1/2 (RX/OR ONLY) (NEEDLE) IMPLANT
NEEDLE HYPO 25GX1X1/2 BEV (NEEDLE) IMPLANT
NS IRRIG 500ML POUR BTL (IV SOLUTION) ×2 IMPLANT
PACK EXTREMITY ARMC (MISCELLANEOUS) ×2 IMPLANT
SOL ANTI-FOG 6CC FOG-OUT (MISCELLANEOUS) IMPLANT
SOL FOG-OUT ANTI-FOG 6CC (MISCELLANEOUS) ×1
STOCKINETTE IMPERVIOUS LG (DRAPES) ×2 IMPLANT
STRIP CLOSURE SKIN 1/4X4 (GAUZE/BANDAGES/DRESSINGS) IMPLANT
SUT ETHILON 4-0 (SUTURE) ×1
SUT ETHILON 4-0 FS2 18XMFL BLK (SUTURE) ×1
SUT ETHILON 5-0 FS-2 18 BLK (SUTURE) IMPLANT
SUT MNCRL 4-0 (SUTURE)
SUT MNCRL 4-0 27XMFL (SUTURE)
SUT VIC AB 4-0 FS2 27 (SUTURE) ×1 IMPLANT
SUT VICRYL AB 3-0 FS1 BRD 27IN (SUTURE) IMPLANT
SUTURE ETHLN 4-0 FS2 18XMF BLK (SUTURE) IMPLANT
SUTURE MNCRL 4-0 27XMF (SUTURE) IMPLANT
SYR 10ML LL (SYRINGE) ×1 IMPLANT
SYR 5ML LL (SYRINGE) IMPLANT
WAND TOPAZ MICRO DEBRIDER (MISCELLANEOUS) ×1 IMPLANT

## 2018-03-07 NOTE — Transfer of Care (Signed)
Immediate Anesthesia Transfer of Care Note  Patient: Keith Osborne  Procedure(s) Performed: Endoscopic Plantar Fasc. Release Right (Right Foot) Baxter's Release (Right Foot)  Patient Location: PACU  Anesthesia Type: General  Level of Consciousness: awake, alert  and patient cooperative  Airway and Oxygen Therapy: Patient Spontanous Breathing and Patient connected to supplemental oxygen  Post-op Assessment: Post-op Vital signs reviewed, Patient's Cardiovascular Status Stable, Respiratory Function Stable, Patent Airway and No signs of Nausea or vomiting  Post-op Vital Signs: Reviewed and stable  Complications: No apparent anesthesia complications

## 2018-03-07 NOTE — Op Note (Signed)
Operative note   Surgeon:Tareek Sabo Lawyer: None    Preop diagnosis: 1.  Right heel plantar fasciitis 2.  Distal tarsal tunnel syndrome right heel    Postop diagnosis: Same    Procedure: 1.  Endoscopic plantar fasciotomy right heel 2.  Distal tarsal tunnel release right medial heel    EBL: Minimal    Anesthesia:local and general.  Local consisted of a one-to-one mixture of 0.25% bupivacaine with epinephrine and Exparel long-acting anesthetic preoperatively.  Postoperatively an additional 10 cc of Exparel was used.   Hemostasis: Thigh tourniquet inflated to 250 mmHg for approximately 30 minutes    Specimen: None    Complications: None    Operative indications:Keith Osborne is an 48 y.o. that presents today for surgical intervention.  The risks/benefits/alternatives/complications have been discussed and consent has been given.    Procedure:  Patient was brought into the OR and placed on the operating table in thesupine position. After anesthesia was obtained theright lower extremity was prepped and draped in usual sterile fashion.  Attention was directed to the medial aspect of the right heel where a small stab incision was placed at the plantar fascial insertion.  Blunt dissection carried down to the plantar fascia.  Plantar fascial elevator was introduced.  The blunt trocar and cannula was introduced from medial to lateral.  Small stab incision was made laterally.  The blunt trocar was removed.  The plantar fascial ligament was evaluated with the endoscope.  The medial 1/3-1/2 of the plantar fascial ligament was then incised with a diamond knife.  Good release was noted.  The wound was flushed with copious amounts of irrigation.  Removal of the blunt trocar cannula was performed.  The medial aspect of the heel incision was then brought more proximal.  The superficial fascia overlying the muscle belly was incised.  The muscle belly was moved dorsal and plantar and the deep  fascial layer was incised.  The adipose tissue deep around Baxter's nerve was noted and good release of the deep fascial layer was performed.  This wound was then flushed with copious amounts of irrigation.  Closure was then performed with a 4-0 Vicryl deep layer and a 5-0 nylon for the skin.  Multiple small stab incision was made on the plantar heel and the Topaz wand was introduced and a micro-fasciotomy was then performed.  The postoperatively Exparel was introduced into all incision sites.  A bulky sterile dressing was applied.  Patient was placed in an equalizer walker boot with the foot at 90 degrees.  He tolerated the procedure and anesthesia well.  A prescription for Percocet and Zofran was sent to his pharmacy.  I will see him in 1 week to reevaluate.    Patient tolerated the procedure and anesthesia well.  Was transported from the OR to the PACU with all vital signs stable and vascular status intact. To be discharged per routine protocol.  Will follow up in approximately 1 week in the outpatient clinic.

## 2018-03-07 NOTE — Discharge Instructions (Signed)
[facility] °[facility] ° °POST OPERATIVE INSTRUCTIONS FOR DR. TROXLER AND DR. FOWLER °[facility] ° ° °1. Take your medication as prescribed.  Pain medication should be taken only as needed. ° °2. Keep the dressing clean, dry and intact. ° °3. Keep your foot elevated above the heart level for the first 48 hours. ° °4. Walking to the bathroom and brief periods of walking are acceptable, unless we have instructed you to be non-weight bearing. ° °5. Always wear your post-op shoe when walking.  Always use your crutches if you are to be non-weight bearing. ° °6. Do not take a shower. Baths are permissible as long as the foot is kept out of the water.  ° °7. Every hour you are awake:  °- Bend your knee 15 times. °- Flex foot 15 times °- Massage calf 15 times ° °8. Call [facility] ([phone]) if any of the following problems occur: °- You develop a temperature or fever. °- The bandage becomes saturated with blood. °- Medication does not stop your pain. °- Injury of the foot occurs. °- Any symptoms of infection including redness, odor, or red streaks running from wound. ° ° °General Anesthesia, Adult, Care After °This sheet gives you information about how to care for yourself after your procedure. Your health care provider may also give you more specific instructions. If you have problems or questions, contact your health care provider. °What can I expect after the procedure? °After the procedure, the following side effects are common: °· Pain or discomfort at the IV site. °· Nausea. °· Vomiting. °· Sore throat. °· Trouble concentrating. °· Feeling cold or chills. °· Weak or tired. °· Sleepiness and fatigue. °· Soreness and body aches. These side effects can affect parts of the body that were not involved in surgery. °Follow these instructions at home: ° °For at least 24 hours after the procedure: °· Have a responsible adult stay with you. It is important to  have someone help care for you until you are awake and alert. °· Rest as needed. °· Do not: °? Participate in activities in which you could fall or become injured. °? Drive. °? Use heavy machinery. °? Drink alcohol. °? Take sleeping pills or medicines that cause drowsiness. °? Make important decisions or sign legal documents. °? Take care of children on your own. °Eating and drinking °· Follow any instructions from your health care provider about eating or drinking restrictions. °· When you feel hungry, start by eating small amounts of foods that are soft and easy to digest (bland), such as toast. Gradually return to your regular diet. °· Drink enough fluid to keep your urine pale yellow. °· If you vomit, rehydrate by drinking water, juice, or clear broth. °General instructions °· If you have sleep apnea, surgery and certain medicines can increase your risk for breathing problems. Follow instructions from your health care provider about wearing your sleep device: °? Anytime you are sleeping, including during daytime naps. °? While taking prescription pain medicines, sleeping medicines, or medicines that make you drowsy. °· Return to your normal activities as told by your health care provider. Ask your health care provider what activities are safe for you. °· Take over-the-counter and prescription medicines only as told by your health care provider. °· If you smoke, do not smoke without supervision. °· Keep all follow-up visits as told by your health care provider. This is important. °Contact a health care provider if: °· You have   nausea or vomiting that does not get better with medicine. °· You cannot eat or drink without vomiting. °· You have pain that does not get better with medicine. °· You are unable to pass urine. °· You develop a skin rash. °· You have a fever. °· You have redness around your IV site that gets worse. °Get help right away if: °· You have difficulty breathing. °· You have chest pain. °· You  have blood in your urine or stool, or you vomit blood. °Summary °· After the procedure, it is common to have a sore throat or nausea. It is also common to feel tired. °· Have a responsible adult stay with you for the first 24 hours after general anesthesia. It is important to have someone help care for you until you are awake and alert. °· When you feel hungry, start by eating small amounts of foods that are soft and easy to digest (bland), such as toast. Gradually return to your regular diet. °· Drink enough fluid to keep your urine pale yellow. °· Return to your normal activities as told by your health care provider. Ask your health care provider what activities are safe for you. °This information is not intended to replace advice given to you by your health care provider. Make sure you discuss any questions you have with your health care provider. °Document Released: 05/09/2000 Document Revised: 09/16/2016 Document Reviewed: 09/16/2016 °Elsevier Interactive Patient Education © 2019 [employer] ° °

## 2018-03-07 NOTE — Anesthesia Postprocedure Evaluation (Signed)
Anesthesia Post Note  Patient: Keith Osborne  Procedure(s) Performed: Endoscopic Plantar Fasc. Release Right (Right Foot) Baxter's Release (Right Foot)  Patient location during evaluation: PACU Anesthesia Type: General Level of consciousness: awake and alert, oriented and patient cooperative Pain management: pain level controlled Vital Signs Assessment: post-procedure vital signs reviewed and stable Respiratory status: spontaneous breathing, nonlabored ventilation and respiratory function stable Cardiovascular status: blood pressure returned to baseline and stable Postop Assessment: adequate PO intake Anesthetic complications: no    Darrin Nipper

## 2018-03-07 NOTE — Anesthesia Procedure Notes (Signed)
Procedure Name: LMA Insertion Date/Time: 03/07/2018 2:03 PM Performed by: Mayme Genta, CRNA Pre-anesthesia Checklist: Patient identified, Emergency Drugs available, Suction available, Timeout performed and Patient being monitored Patient Re-evaluated:Patient Re-evaluated prior to induction Oxygen Delivery Method: Circle system utilized Preoxygenation: Pre-oxygenation with 100% oxygen Induction Type: IV induction LMA: LMA inserted LMA Size: 4.0 Number of attempts: 1 Placement Confirmation: positive ETCO2 and breath sounds checked- equal and bilateral Tube secured with: Tape

## 2018-03-07 NOTE — H&P (Signed)
HISTORY AND PHYSICAL INTERVAL NOTE:  03/07/2018  1:38 PM  Keith Osborne  has presented today for surgery, with the diagnosis of M72.2 Plantar fasciitis G57.51 Tarsal Tunnel syndrome right side.  The various methods of treatment have been discussed with the patient.  No guarantees were given.  After consideration of risks, benefits and other options for treatment, the patient has consented to surgery.  I have reviewed the patients' chart and labs.     A history and physical examination was performed in my office.  The patient was reexamined.  There have been no changes to this history and physical examination.  Samara Deist A

## 2018-03-08 ENCOUNTER — Encounter: Payer: Self-pay | Admitting: Podiatry

## 2018-05-30 DIAGNOSIS — Z86018 Personal history of other benign neoplasm: Secondary | ICD-10-CM

## 2020-10-13 ENCOUNTER — Other Ambulatory Visit: Payer: Self-pay

## 2020-10-13 ENCOUNTER — Other Ambulatory Visit
Admission: RE | Admit: 2020-10-13 | Discharge: 2020-10-13 | Disposition: A | Discharge status: Home / Self Care | Payer: BC Managed Care – PPO | Source: Ambulatory Visit | Attending: Specialist | Admitting: Specialist

## 2020-10-13 DIAGNOSIS — Z20822 Contact with and (suspected) exposure to covid-19: Secondary | ICD-10-CM | POA: Insufficient documentation

## 2020-10-13 DIAGNOSIS — Z01812 Encounter for preprocedural laboratory examination: Secondary | ICD-10-CM | POA: Insufficient documentation

## 2020-10-14 ENCOUNTER — Other Ambulatory Visit: Payer: BC Managed Care – PPO

## 2020-10-14 LAB — SARS CORONAVIRUS 2 (TAT 6-24 HRS): SARS Coronavirus 2: NEGATIVE

## 2020-10-15 ENCOUNTER — Ambulatory Visit: Payer: BC Managed Care – PPO | Attending: Neurology

## 2020-10-15 DIAGNOSIS — G4733 Obstructive sleep apnea (adult) (pediatric): Secondary | ICD-10-CM | POA: Diagnosis not present

## 2020-10-16 ENCOUNTER — Other Ambulatory Visit: Payer: Self-pay

## 2021-01-19 ENCOUNTER — Other Ambulatory Visit: Payer: Self-pay | Admitting: Student

## 2021-01-19 DIAGNOSIS — N644 Mastodynia: Secondary | ICD-10-CM

## 2021-01-19 DIAGNOSIS — N6323 Unspecified lump in the left breast, lower outer quadrant: Secondary | ICD-10-CM

## 2021-01-20 ENCOUNTER — Inpatient Hospital Stay
Admission: RE | Admit: 2021-01-20 | Discharge: 2021-01-20 | Disposition: A | Discharge status: Home / Self Care | Payer: Self-pay | Source: Ambulatory Visit | Attending: *Deleted | Admitting: *Deleted

## 2021-01-20 ENCOUNTER — Other Ambulatory Visit: Payer: Self-pay | Admitting: *Deleted

## 2021-01-20 DIAGNOSIS — Z1231 Encounter for screening mammogram for malignant neoplasm of breast: Secondary | ICD-10-CM

## 2021-01-25 ENCOUNTER — Other Ambulatory Visit: Payer: Self-pay | Admitting: Student

## 2021-01-27 ENCOUNTER — Other Ambulatory Visit: Payer: Self-pay | Admitting: Student

## 2021-01-27 DIAGNOSIS — N644 Mastodynia: Secondary | ICD-10-CM

## 2021-01-27 DIAGNOSIS — N6323 Unspecified lump in the left breast, lower outer quadrant: Secondary | ICD-10-CM

## 2021-02-05 ENCOUNTER — Ambulatory Visit
Admission: RE | Admit: 2021-02-05 | Discharge: 2021-02-05 | Disposition: A | Discharge status: Home / Self Care | Payer: BC Managed Care – PPO | Source: Ambulatory Visit | Attending: Student | Admitting: Student

## 2021-02-05 ENCOUNTER — Other Ambulatory Visit: Payer: Self-pay

## 2021-02-05 DIAGNOSIS — N6323 Unspecified lump in the left breast, lower outer quadrant: Secondary | ICD-10-CM | POA: Diagnosis present

## 2021-02-05 DIAGNOSIS — N644 Mastodynia: Secondary | ICD-10-CM | POA: Insufficient documentation

## 2022-02-18 ENCOUNTER — Ambulatory Visit: Admission: RE | Admit: 2022-02-18 | Payer: BC Managed Care – PPO | Source: Home / Self Care

## 2022-02-18 ENCOUNTER — Encounter: Admission: RE | Payer: Self-pay | Source: Home / Self Care

## 2022-02-18 SURGERY — COLONOSCOPY WITH PROPOFOL
Anesthesia: General

## 2022-02-23 ENCOUNTER — Telehealth: Payer: Self-pay

## 2022-02-23 DIAGNOSIS — Z1211 Encounter for screening for malignant neoplasm of colon: Secondary | ICD-10-CM

## 2022-02-23 NOTE — Telephone Encounter (Signed)
Gastroenterology Pre-Procedure Review  Request Date: TBD on a Friday. Patients wife would like to research the fees associated with the colonoscopy.   Requesting Physician: Dr. Allen Norris '@[facility]   PATIENT REVIEW QUESTIONS: The patient responded to the following health history questions as indicated:    1. Are you having any GI issues? no 2. Do you have a personal history of Polyps? no 3. Do you have a family history of Colon Cancer or Polyps? no 4. Diabetes Mellitus? Yes takes Trulicity has been advised will need to stop 7 days prior to colonoscopy. May take BP medications the morning of procedure with sip of water only 5. Joint replacements in the past 12 months?no 6. Major health problems in the past 3 months?no 7. Any artificial heart valves, MVP, or defibrillator?no    MEDICATIONS & ALLERGIES:    Patient reports the following regarding taking any anticoagulation/antiplatelet therapy:   Plavix, Coumadin, Eliquis, Xarelto, Lovenox, Pradaxa, Brilinta, or Effient? no Aspirin? no  Patient confirms/reports the following medications:  Current Outpatient Medications  Medication Sig Dispense Refill   aspirin 81 MG chewable tablet Chew by mouth daily.     aspirin-acetaminophen-caffeine (MIGRAINE FORMULA) 250-250-65 MG per tablet As directed by package.     cetirizine-pseudoephedrine (ZYRTEC-D) 5-120 MG per tablet Take by mouth.     esomeprazole (NEXIUM) 40 MG capsule Take 40 mg by mouth daily at 12 noon.     FIBER PO Take 1 tablet by mouth daily.     ibuprofen (MOTRIN IB) 200 MG tablet Take 3 tablets (600 mg total) by mouth every 6 (six) hours as needed. 20 tablet 0   lisinopril-hydrochlorothiazide (PRINZIDE,ZESTORETIC) 20-25 MG tablet Take 1 tablet by mouth daily.     MEGARED OMEGA-3 KRILL OIL 500 MG CAPS Take by mouth.     meloxicam (MOBIC) 15 MG tablet Take 1 tablet by mouth daily.     ondansetron (ZOFRAN) 4 MG tablet Take 1 tablet (4 mg total) by mouth every 8 (eight) hours as needed for nausea  or vomiting. (Patient not taking: Reported on 02/23/2018) 8 tablet 0   oxyCODONE-acetaminophen (PERCOCET) 5-325 MG tablet Take 1-2 tablets by mouth every 6 (six) hours as needed for severe pain. 30 tablet 0   oxyCODONE-acetaminophen (ROXICET) 5-325 MG tablet Take 1 tablet by mouth every 6 (six) hours as needed. (Patient not taking: Reported on 02/23/2018) 12 tablet 0   propranolol (INDERAL) 40 MG tablet Take 40 mg by mouth 2 (two) times daily.      tamsulosin (FLOMAX) 0.4 MG CAPS capsule Take 1 capsule (0.4 mg total) by mouth daily. (Patient not taking: Reported on 02/23/2018) 10 capsule 0   Current Facility-Administered Medications  Medication Dose Route Frequency Provider Last Rate Last Admin   triamcinolone acetonide (KENALOG) 10 MG/ML injection 10 mg  10 mg Other Once Wallene Huh, DPM        Patient confirms/reports the following allergies:  Allergies  Allergen Reactions   Cyclobenzaprine Shortness Of Breath    No orders of the defined types were placed in this encounter.   AUTHORIZATION INFORMATION Primary Insurance: 1D#: Group #:  Secondary Insurance: 1D#: Group #:  SCHEDULE INFORMATION: Date: TBD Time: Location: [place]

## 2022-03-03 NOTE — Telephone Encounter (Signed)
Returned patients phone call.  Left voice message for patient to call back to complete scheduling his colonoscopy with Dr. Allen Norris at [facility].  Thanks,  [place], [place]

## 2022-03-04 ENCOUNTER — Other Ambulatory Visit: Payer: Self-pay

## 2022-03-04 DIAGNOSIS — Z1211 Encounter for screening for malignant neoplasm of colon: Secondary | ICD-10-CM

## 2022-03-04 NOTE — Telephone Encounter (Signed)
Patient called back.  Call returned.  He has been scheduled for his colonoscopy 03/18/22 at [facility] with Dr. Allen Norris.  Ht 5'5 Wt 250 lb BMI 41.6.  He has his colonoscopy prep already from previously scheduled and cancellation with [facility].  He has been advised to begin low fiber diet 01/30 and 01/31. Clear Liquid Diet 02/01. Stop Trulicity 18/28/83.  Thanks,  [place], [place]

## 2022-03-08 ENCOUNTER — Other Ambulatory Visit: Payer: Self-pay | Admitting: Urology

## 2022-03-08 DIAGNOSIS — R972 Elevated prostate specific antigen [PSA]: Secondary | ICD-10-CM

## 2022-03-09 ENCOUNTER — Encounter: Payer: Self-pay | Admitting: Gastroenterology

## 2022-03-18 ENCOUNTER — Ambulatory Visit: Payer: BC Managed Care – PPO | Admitting: Anesthesiology

## 2022-03-18 ENCOUNTER — Other Ambulatory Visit: Payer: Self-pay

## 2022-03-18 ENCOUNTER — Ambulatory Visit
Admission: RE | Admit: 2022-03-18 | Discharge: 2022-03-18 | Disposition: A | Discharge status: Home / Self Care | Payer: BC Managed Care – PPO | Attending: Gastroenterology | Admitting: Gastroenterology

## 2022-03-18 ENCOUNTER — Encounter
Admission: RE | Disposition: A | Discharge status: Home / Self Care | Payer: Self-pay | Source: Home / Self Care | Attending: Gastroenterology

## 2022-03-18 ENCOUNTER — Encounter: Payer: Self-pay | Admitting: Gastroenterology

## 2022-03-18 DIAGNOSIS — N400 Enlarged prostate without lower urinary tract symptoms: Secondary | ICD-10-CM | POA: Insufficient documentation

## 2022-03-18 DIAGNOSIS — Z6841 Body Mass Index (BMI) 40.0 and over, adult: Secondary | ICD-10-CM | POA: Insufficient documentation

## 2022-03-18 DIAGNOSIS — G4730 Sleep apnea, unspecified: Secondary | ICD-10-CM | POA: Diagnosis not present

## 2022-03-18 DIAGNOSIS — Z87442 Personal history of urinary calculi: Secondary | ICD-10-CM | POA: Insufficient documentation

## 2022-03-18 DIAGNOSIS — K635 Polyp of colon: Secondary | ICD-10-CM

## 2022-03-18 DIAGNOSIS — K640 First degree hemorrhoids: Secondary | ICD-10-CM | POA: Insufficient documentation

## 2022-03-18 DIAGNOSIS — E119 Type 2 diabetes mellitus without complications: Secondary | ICD-10-CM | POA: Insufficient documentation

## 2022-03-18 DIAGNOSIS — Z01818 Encounter for other preprocedural examination: Secondary | ICD-10-CM

## 2022-03-18 DIAGNOSIS — Z1211 Encounter for screening for malignant neoplasm of colon: Secondary | ICD-10-CM | POA: Insufficient documentation

## 2022-03-18 DIAGNOSIS — K219 Gastro-esophageal reflux disease without esophagitis: Secondary | ICD-10-CM | POA: Diagnosis not present

## 2022-03-18 DIAGNOSIS — D125 Benign neoplasm of sigmoid colon: Secondary | ICD-10-CM | POA: Insufficient documentation

## 2022-03-18 DIAGNOSIS — I10 Essential (primary) hypertension: Secondary | ICD-10-CM | POA: Insufficient documentation

## 2022-03-18 HISTORY — DX: Unspecified osteoarthritis, unspecified site: M19.90

## 2022-03-18 HISTORY — PX: COLONOSCOPY WITH PROPOFOL: SHX5780

## 2022-03-18 HISTORY — PX: POLYPECTOMY: SHX5525

## 2022-03-18 HISTORY — DX: Myoneural disorder, unspecified: G70.9

## 2022-03-18 HISTORY — DX: Type 2 diabetes mellitus without complications: E11.9

## 2022-03-18 LAB — GLUCOSE, CAPILLARY: Glucose-Capillary: 101 mg/dL — ABNORMAL HIGH (ref 70–99)

## 2022-03-18 SURGERY — COLONOSCOPY WITH PROPOFOL
Anesthesia: General | Site: Rectum

## 2022-03-18 MED ORDER — ONDANSETRON HCL 4 MG/2ML IJ SOLN: 4.0000 mg | Freq: Once | INTRAMUSCULAR | Status: DC | PRN

## 2022-03-18 MED ORDER — ACETAMINOPHEN 325 MG PO TABS: 650.0000 mg | ORAL_TABLET | Freq: Once | ORAL | Status: DC | PRN

## 2022-03-18 MED ORDER — LIDOCAINE HCL (CARDIAC) PF 100 MG/5ML IV SOSY
PREFILLED_SYRINGE | INTRAVENOUS | Status: DC | PRN
  Administered 2022-03-18: 100 mg via INTRAVENOUS

## 2022-03-18 MED ORDER — ACETAMINOPHEN 160 MG/5ML PO SOLN: 325.0000 mg | ORAL | Status: DC | PRN

## 2022-03-18 MED ORDER — STERILE WATER FOR IRRIGATION IR SOLN
Status: DC | PRN
  Administered 2022-03-18: 150 mL

## 2022-03-18 MED ORDER — SODIUM CHLORIDE 0.9 % IV SOLN: INTRAVENOUS | Status: DC

## 2022-03-18 MED ORDER — LACTATED RINGERS IV SOLN: INTRAVENOUS | Status: DC

## 2022-03-18 MED ORDER — PROPOFOL 10 MG/ML IV BOLUS
INTRAVENOUS | Status: DC | PRN
  Administered 2022-03-18: 40 mg via INTRAVENOUS
  Administered 2022-03-18 (×2): 50 mg via INTRAVENOUS
  Administered 2022-03-18: 90 mg via INTRAVENOUS
  Administered 2022-03-18: 30 mg via INTRAVENOUS

## 2022-03-18 SURGICAL SUPPLY — 21 items

## 2022-03-18 NOTE — H&P (Signed)
Keith Lame, MD [facility] [address], [place], [place] [address] Phone: [phone] Fax : [phone]  Primary Care Physician:  Maryland Pink, MD Primary Gastroenterologist:  Dr. Allen Norris  Pre-Procedure History & Physical: HPI:  [name] is a 52 y.o. male is here for a screening colonoscopy.   Past Medical History:  Diagnosis Date   Acid reflux    Arthritis    Diabetes mellitus without complication (HCC)    Hx of dysplastic nevus 05/25/2016   R top of shoulder   Hypertension    Migraines    Neuromuscular disorder ([place])    Sleep apnea    uses CPAP    Past Surgical History:  Procedure Laterality Date   FOOT SURGERY Left    NERVE REPAIR Right 03/07/2018   Procedure: Baxter's Release;  Surgeon: Samara Deist, DPM;  Location: [place];  Service: Podiatry;  Laterality: Right;   PLANTAR FASCIA RELEASE Right 03/07/2018   Procedure: Endoscopic Plantar Fasc. Release Right;  Surgeon: Samara Deist, DPM;  Location: [place];  Service: Podiatry;  Laterality: Right;  general with local   TONSILLECTOMY     52 y.o.   WISDOM TOOTH EXTRACTION      Prior to Admission medications   Medication Sig Start Date End Date Taking? Authorizing Provider  aspirin 81 MG chewable tablet Chew by mouth daily.   Yes [provider]  aspirin-acetaminophen-caffeine (MIGRAINE FORMULA) [phone] MG per tablet As directed by package.   Yes [provider]  cetirizine-pseudoephedrine (ZYRTEC-D) 5-120 MG per tablet Take by mouth.   Yes [provider]  Dulaglutide (TRULICITY) 2.11 HE/1.7EY SOPN Inject 0.75 mg into the skin 7 days.   Yes [provider]  esomeprazole (NEXIUM) 40 MG capsule Take 40 mg by mouth daily at 12 noon.   Yes [provider]  FIBER PO Take 1 tablet by mouth daily.   Yes [provider]  ibuprofen (MOTRIN IB) 200 MG tablet Take 3 tablets (600 mg total) by mouth every 6 (six) hours as needed. 01/21/16   Yes Schuyler Amor, MD  lisinopril-hydrochlorothiazide (PRINZIDE,ZESTORETIC) 20-25 MG tablet Take 1 tablet by mouth daily.   Yes [provider]  meloxicam (MOBIC) 15 MG tablet Take 1 tablet by mouth daily. 01/13/16  Yes [provider]  oxyCODONE-acetaminophen (PERCOCET) 5-325 MG tablet Take 1-2 tablets by mouth every 6 (six) hours as needed for severe pain. 03/07/18  Yes Samara Deist, DPM  propranolol (INDERAL) 40 MG tablet Take 40 mg by mouth 2 (two) times daily.  05/01/13  Yes [provider]  tamsulosin (FLOMAX) 0.4 MG CAPS capsule Take 1 capsule (0.4 mg total) by mouth daily. 01/21/16  Yes McShane, Gerda Diss, MD  MEGARED OMEGA-3 KRILL OIL 500 MG CAPS Take by mouth. Patient not taking: Reported on 03/09/2022    [provider]  ondansetron (ZOFRAN) 4 MG tablet Take 1 tablet (4 mg total) by mouth every 8 (eight) hours as needed for nausea or vomiting. Patient not taking: Reported on 02/23/2018 01/21/16   Schuyler Amor, MD  oxyCODONE-acetaminophen (ROXICET) 5-325 MG tablet Take 1 tablet by mouth every 6 (six) hours as needed. Patient not taking: Reported on 02/23/2018 01/21/16   Schuyler Amor, MD    Allergies as of 03/04/2022 - Review Complete 05/30/2018  Allergen Reaction Noted   Cyclobenzaprine Shortness Of Breath 10/25/2013    Family History  Problem Relation Age of Onset   Breast cancer Maternal Aunt  Social History   Socioeconomic History   Marital status: Married    Spouse name: Not on file   Number of children: Not on file   Years of education: Not on file   Highest education level: Not on file  Occupational History   Not on file  Tobacco Use   Smoking status: Never   Smokeless tobacco: Never  Substance and Sexual Activity   Alcohol use: Yes    Comment: occ.   Drug use: No   Sexual activity: Not on file  Other Topics Concern   Not on file  Social History Narrative   Not on file   Social Determinants of Health    Financial Resource Strain: Not on file  Food Insecurity: Not on file  Transportation Needs: Not on file  Physical Activity: Not on file  Stress: Not on file  Social Connections: Not on file  Intimate Partner Violence: Not on file    Review of Systems: See HPI, otherwise negative ROS  Physical Exam: BP 99/70   Temp 99.7 F (37.6 C) (Tympanic)   Resp 17   Ht '5\' 5"'$  (1.651 m)   Wt 113.3 kg   SpO2 95%   BMI 41.57 kg/m  General:   Alert,  pleasant and cooperative in NAD Head:  Normocephalic and atraumatic. Neck:  Supple; no masses or thyromegaly. Lungs:  Clear throughout to auscultation.    Heart:  Regular rate and rhythm. Abdomen:  Soft, nontender and nondistended. Normal bowel sounds, without guarding, and without rebound.   Neurologic:  Alert and  oriented x4;  grossly normal neurologically.  Impression/Plan: Keith Osborne is now here to undergo a screening colonoscopy.  Risks, benefits, and alternatives regarding colonoscopy have been reviewed with the patient.  Questions have been answered.  All parties agreeable.

## 2022-03-18 NOTE — Anesthesia Preprocedure Evaluation (Addendum)
Anesthesia Evaluation  Patient identified by MRN, date of birth, ID band Patient awake    Reviewed: Allergy & Precautions, NPO status , Patient's Chart, lab work & pertinent test results  History of Anesthesia Complications Negative for: history of anesthetic complications  Airway Mallampati: IV   Neck ROM: Full    Dental no notable dental hx.    Pulmonary sleep apnea and Continuous Positive Airway Pressure Ventilation    Pulmonary exam normal breath sounds clear to auscultation       Cardiovascular hypertension, Normal cardiovascular exam Rhythm:Regular Rate:Normal     Neuro/Psych  Headaches    GI/Hepatic ,GERD  ,,  Endo/Other  diabetes, Type 2  Class 3 obesity  Renal/GU Renal disease (nephrolithiasis)   BPH    Musculoskeletal   Abdominal   Peds  Hematology negative hematology ROS (+)   Anesthesia Other Findings   Reproductive/Obstetrics                             Anesthesia Physical Anesthesia Plan  ASA: 3  Anesthesia Plan: General   Post-op Pain Management:    Induction: Intravenous  PONV Risk Score and Plan: 2 and Propofol infusion, TIVA and Treatment may vary due to age or medical condition  Airway Management Planned: Natural Airway  Additional Equipment:   Intra-op Plan:   Post-operative Plan:   Informed Consent: I have reviewed the patients History and Physical, chart, labs and discussed the procedure including the risks, benefits and alternatives for the proposed anesthesia with the patient or authorized representative who has indicated his/her understanding and acceptance.       Plan Discussed with: CRNA  Anesthesia Plan Comments: (LMA/GETA backup discussed.  Patient consented for risks of anesthesia including but not limited to:  - adverse reactions to medications - damage to eyes, teeth, lips or other oral mucosa - nerve damage due to positioning  -  sore throat or hoarseness - damage to heart, brain, nerves, lungs, other parts of body or loss of life  Informed patient about role of CRNA in peri- and intra-operative care.  Patient voiced understanding.)       Anesthesia Quick Evaluation

## 2022-03-18 NOTE — Anesthesia Postprocedure Evaluation (Signed)
Anesthesia Post Note  Patient: Keith Osborne  Procedure(s) Performed: COLONOSCOPY WITH PROPOFOL (Rectum) POLYPECTOMY (Rectum)  Patient location during evaluation: PACU Anesthesia Type: General Level of consciousness: awake and alert, oriented and patient cooperative Pain management: pain level controlled Vital Signs Assessment: post-procedure vital signs reviewed and stable Respiratory status: spontaneous breathing, nonlabored ventilation and respiratory function stable Cardiovascular status: blood pressure returned to baseline and stable Postop Assessment: adequate PO intake Anesthetic complications: no   No notable events documented.   Last Vitals:  Vitals:   03/18/22 0915 03/18/22 0919  BP: 109/65 99/68  Pulse: 70 66  Resp: 13 13  Temp:  36.6 C  SpO2: 96% 98%    Last Pain:  Vitals:   03/18/22 0919  TempSrc:   PainSc: 0-No pain                 Darrin Nipper

## 2022-03-18 NOTE — Transfer of Care (Signed)
Immediate Anesthesia Transfer of Care Note  Patient: Keith Osborne  Procedure(s) Performed: COLONOSCOPY WITH PROPOFOL (Rectum) POLYPECTOMY (Rectum)  Patient Location: PACU  Anesthesia Type: General  Level of Consciousness: awake, alert  and patient cooperative  Airway and Oxygen Therapy: Patient Spontanous Breathing and Patient connected to supplemental oxygen  Post-op Assessment: Post-op Vital signs reviewed, Patient's Cardiovascular Status Stable, Respiratory Function Stable, Patent Airway and No signs of Nausea or vomiting  Post-op Vital Signs: Reviewed and stable  Complications: No notable events documented.

## 2022-03-18 NOTE — Op Note (Signed)
[facility] Patient Name: Keith Osborne Procedure Date: 03/18/2022 8:35 AM MRN: [id number] Account #: [id number] Date of Birth: 1970/09/06 Admit Type: Outpatient Age: 52 Room: [facility] OR ROOM 01 Gender: Male Note Status: Finalized Instrument Name: [id number] Procedure:             Colonoscopy Indications:           Screening for colorectal malignant neoplasm Providers:             [name] MD, MD Referring MD:          Irven Easterly. Kary Kos, MD (Referring MD) Medicines:             Propofol per Anesthesia Complications:         No immediate complications. Procedure:             Pre-Anesthesia Assessment:                        - Prior to the procedure, a History and Physical was                         performed, and patient medications and allergies were                         reviewed. The patient's tolerance of previous                         anesthesia was also reviewed. The risks and benefits                         of the procedure and the sedation options and risks                         were discussed with the patient. All questions were                         answered, and informed consent was obtained. Prior                         Anticoagulants: The patient has taken no anticoagulant                         or antiplatelet agents. ASA Grade Assessment: II - A                         patient with mild systemic disease. After reviewing                         the risks and benefits, the patient was deemed in                         satisfactory condition to undergo the procedure.                        After obtaining informed consent, the colonoscope was                         passed under direct vision. Throughout the procedure,  the patient's blood pressure, pulse, and oxygen                         saturations were monitored continuously. The was                         introduced through the anus and advanced to  the the                         cecum, identified by appendiceal orifice and ileocecal                         valve. The colonoscopy was performed without                         difficulty. The patient tolerated the procedure well.                         The quality of the bowel preparation was excellent. Findings:      The perianal and digital rectal examinations were normal.      Two sessile polyps were found in the sigmoid colon. The polyps were 2 to       3 mm in size. These polyps were removed with a cold biopsy forceps.       Resection and retrieval were complete.      Non-bleeding internal hemorrhoids were found during retroflexion. The       hemorrhoids were Grade I (internal hemorrhoids that do not prolapse). Impression:            - Two 2 to 3 mm polyps in the sigmoid colon, removed                         with a cold biopsy forceps. Resected and retrieved.                        - Non-bleeding internal hemorrhoids. Recommendation:        - Discharge patient to home.                        - Resume previous diet.                        - Continue present medications. Procedure Code(s):     --- Professional ---                        [phone], Colonoscopy, flexible; with biopsy, single or                         multiple Diagnosis Code(s):     --- Professional ---                        Z12.11, Encounter for screening for malignant neoplasm                         of colon                        D12.5, Benign neoplasm of sigmoid colon CPT copyright 2022 American Medical  Association. All rights reserved. The codes documented in this report are preliminary and upon coder review may  be revised to meet current compliance requirements. [name] MD, MD 03/18/2022 9:02:16 AM This report has been signed electronically. Number of Addenda: 0 Note Initiated On: 03/18/2022 8:35 AM Scope Withdrawal Time: 0 hours 8 minutes 33 seconds  Total Procedure Duration: 0 hours 12 minutes 24  seconds  Estimated Blood Loss:  Estimated blood loss: none.      [facility]

## 2022-03-21 ENCOUNTER — Encounter: Payer: Self-pay | Admitting: Gastroenterology

## 2022-03-22 LAB — SURGICAL PATHOLOGY

## 2022-03-23 ENCOUNTER — Encounter: Payer: Self-pay | Admitting: Gastroenterology

## 2022-04-12 ENCOUNTER — Ambulatory Visit
Admission: RE | Admit: 2022-04-12 | Discharge: 2022-04-12 | Disposition: A | Discharge status: Home / Self Care | Payer: BC Managed Care – PPO | Source: Ambulatory Visit | Attending: Urology | Admitting: Urology

## 2022-04-12 DIAGNOSIS — R972 Elevated prostate specific antigen [PSA]: Secondary | ICD-10-CM

## 2022-04-12 MED ORDER — GADOPICLENOL 0.5 MMOL/ML IV SOLN
10.0000 mL | Freq: Once | INTRAVENOUS | Status: AC | PRN
  Administered 2022-04-12: 10 mL via INTRAVENOUS

## 2022-11-26 IMAGING — MG DIGITAL DIAGNOSTIC BILAT W/ TOMO W/ CAD
8 of 15 series · 8 of 40 positions shown · non-contrast
Comparison: Previous exam(s).

ACR Breast Density Category a: The breast tissue is almost entirely
fatty.

CLINICAL DATA: 50-year-old female with a painful lump along the
inferolateral left chest wall at the inframammary fold. The patient
states the lump has been there for at least 6-7 months. It waxes and
wanes in size with associated pain and inflammation. Most recently
he was started on a course of antibiotics with complete resolution
of symptoms as a result.

EXAM:
DIGITAL DIAGNOSTIC BILATERAL MAMMOGRAM WITH TOMOSYNTHESIS AND CAD;
ULTRASOUND LEFT BREAST LIMITED
TECHNIQUE: Bilateral digital diagnostic mammography and breast tomosynthesis
was performed. The images were evaluated with computer-aided
detection.; Targeted ultrasound examination of the left breast was
performed.

[R MLO synth-2D]
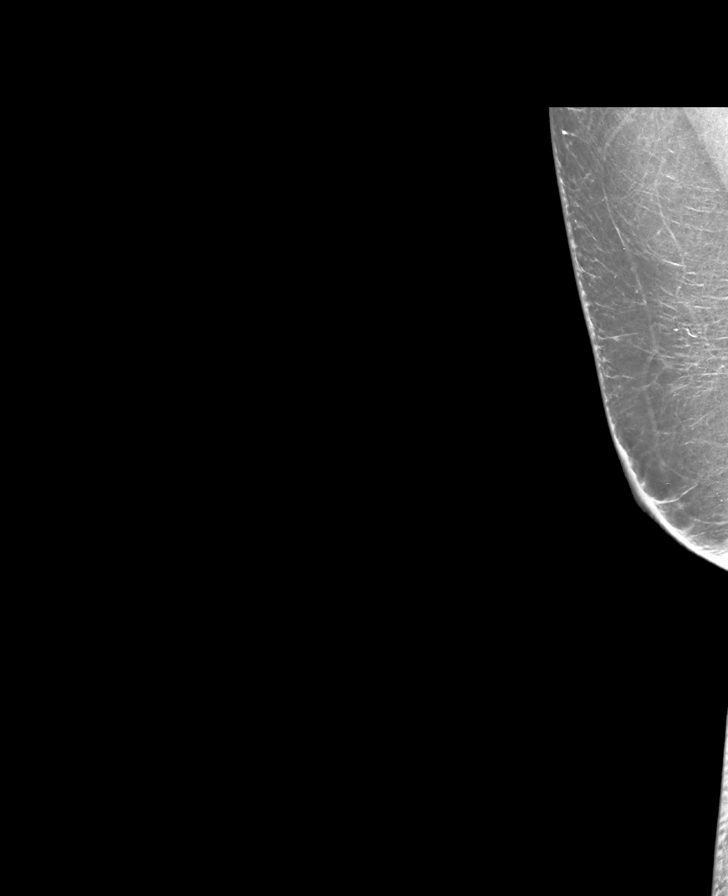

[L MLO synth-2D (1 of 4)]
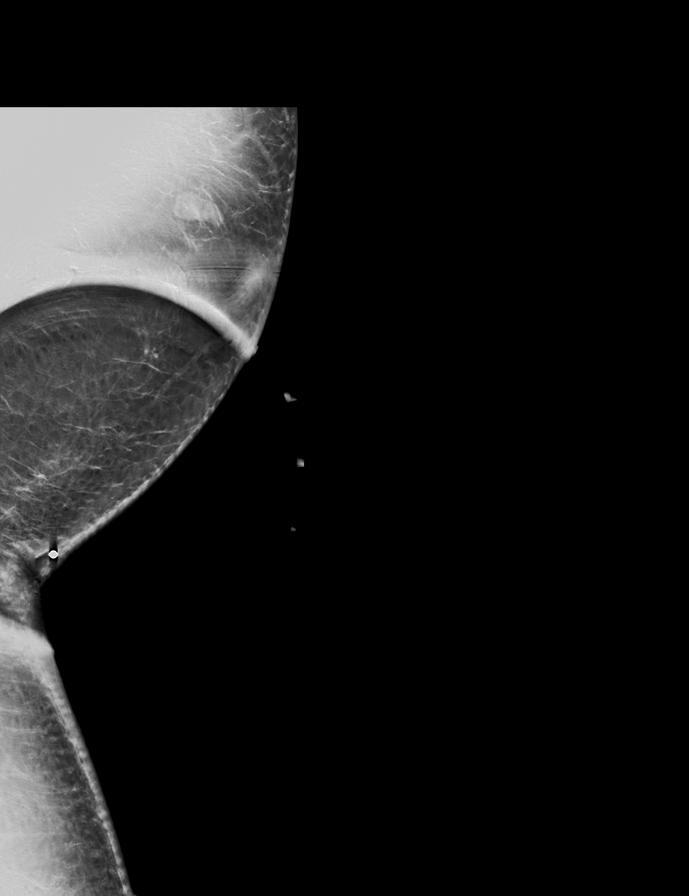

[L MLO synth-2D (2 of 4)]
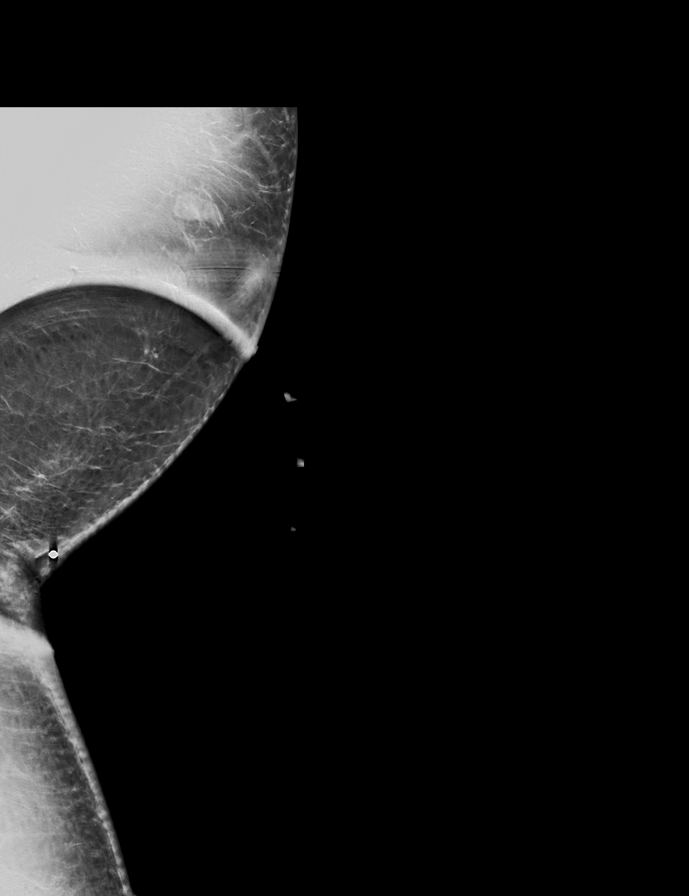

[L XCCL synth-2D]
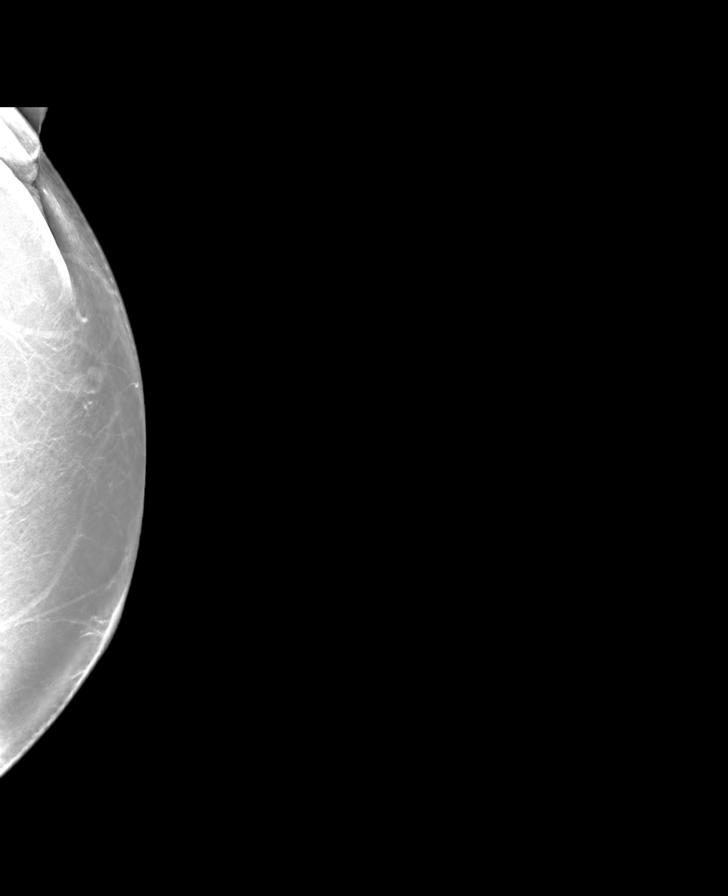

[L MLO synth-2D (3 of 4)]
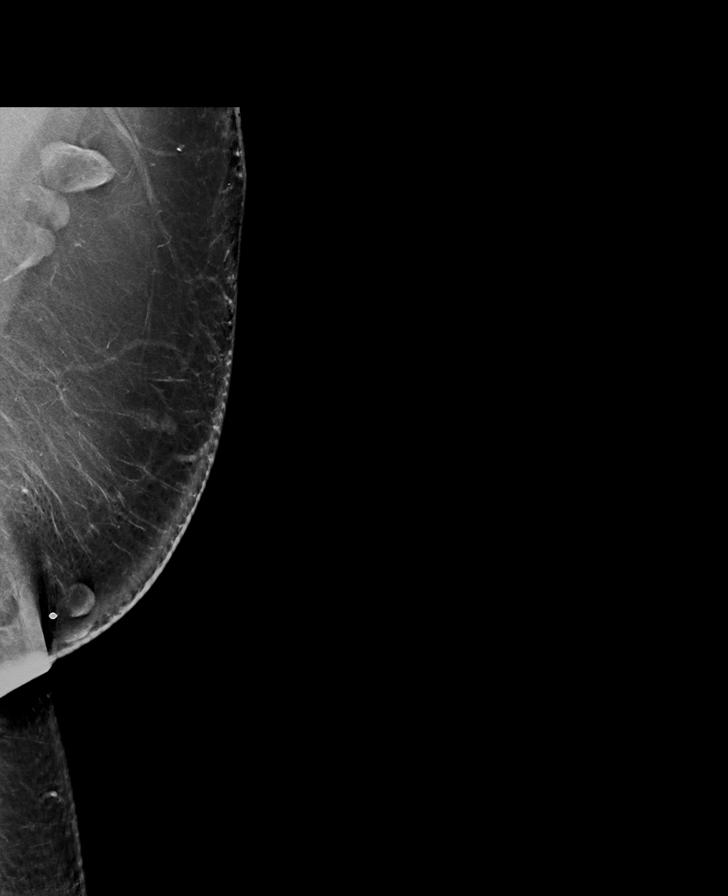

[L MLO synth-2D (4 of 4)]
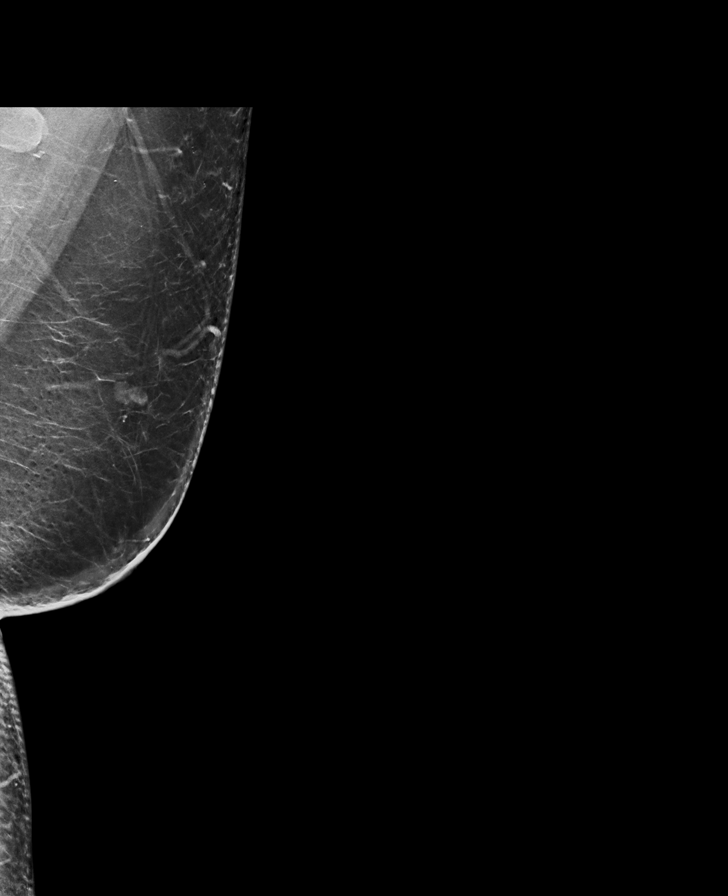

[R CC synth-2D]
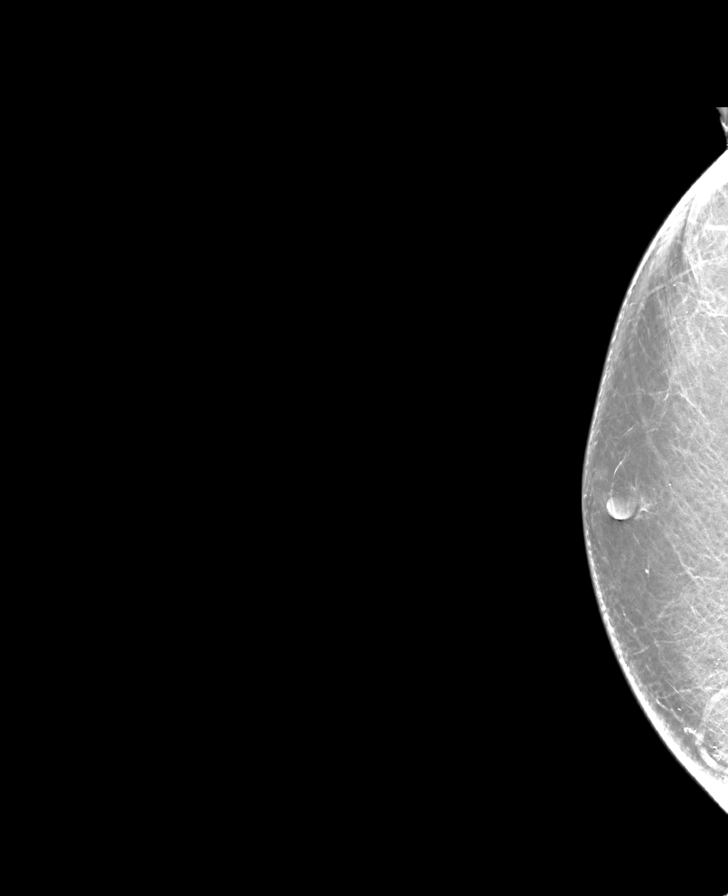

[R CC tomo · tomo slice 50/73.0]
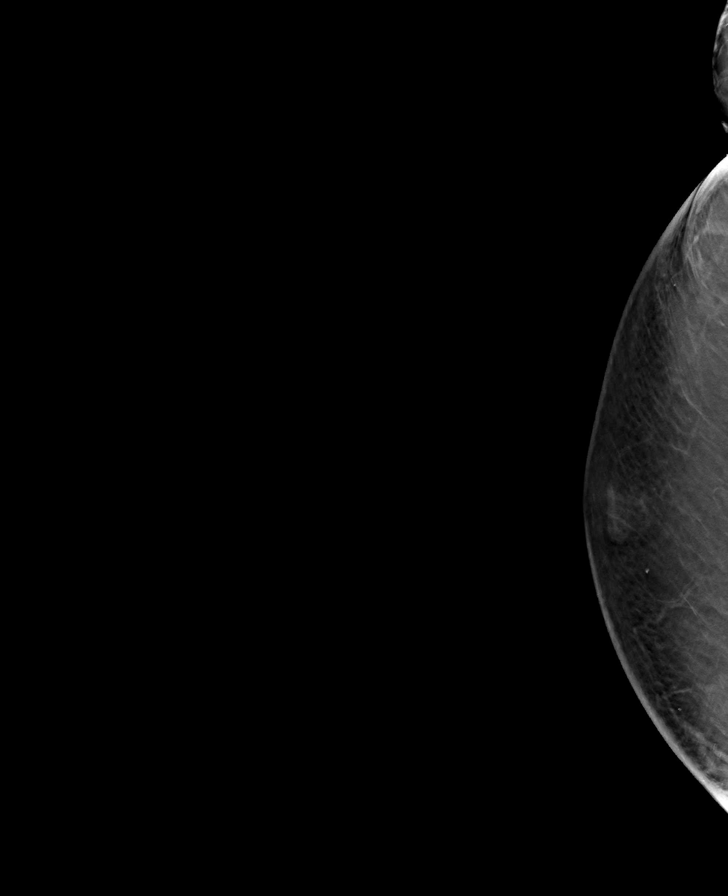

[8 of 40 positions shown; findings below may reference images not displayed]

FINDINGS: Radiopaque BB was placed at the site of the patient's symptoms in
the far lower outer quadrant of the left breast along the
inframammary fold. Evaluation of this region is difficult
mammographically due to positioning. Otherwise, no focal or
suspicious findings within either breast.

Targeted ultrasound is performed, showing an elongated,
circumscribed mass located within the dermis at the 4 o'clock
position 7 cm from the nipple. A tract is visualized from the mass
to the overlying skin. This is best appreciated during real-time
scanning. It measures 1.4 x 1.8 x 0.4 cm. There is peripheral
vascularity.
IMPRESSION: 1. Left breast sebaceous cyst corresponding with the patient's
clinical symptoms. Recommendation is for clinical and symptomatic
follow-up.
2. Otherwise, no suspicious mammographic findings in either breast.

RECOMMENDATION:
Clinical and symptomatic follow-up for the patient's left breast
sebaceous cyst.

I have discussed the findings and recommendations with the patient.
If applicable, a reminder letter will be sent to the patient
regarding the next appointment.

BI-RADS CATEGORY  2: Benign.

## 2023-08-16 ENCOUNTER — Ambulatory Visit: Payer: Self-pay | Admitting: Podiatry

## 2023-08-16 ENCOUNTER — Ambulatory Visit (INDEPENDENT_AMBULATORY_CARE_PROVIDER_SITE_OTHER)

## 2023-08-16 ENCOUNTER — Encounter: Payer: Self-pay | Admitting: Podiatry

## 2023-08-16 DIAGNOSIS — M722 Plantar fascial fibromatosis: Secondary | ICD-10-CM | POA: Diagnosis not present

## 2023-08-16 MED ORDER — MELOXICAM 15 MG PO TABS: 15.0000 mg | ORAL_TABLET | Freq: Every day | ORAL | 3 refills | Status: AC

## 2023-08-16 NOTE — Patient Instructions (Addendum)
 VISIT SUMMARY: Today, you were seen for recurrent heel pain due to plantar fasciitis. You have a history of this condition and had surgery in 2020, but the pain has recently returned. We discussed your symptoms, including the constant pain that worsens with walking on hard surfaces and barefoot. You have tried various orthotics and footwear solutions with limited success.  YOUR PLAN: -PLANTAR FASCIITIS: Plantar fasciitis is a condition where the thick band of tissue on the bottom of your foot becomes inflamed, causing heel pain. Today, we administered a corticosteroid injection to your right heel to provide temporary relief. You are prescribed meloxicam for 30 days with three refills, to be used as needed after one month. We also provided a home therapy plan with exercises and applied a plantar fascial offloading splint for support. We recommend trying Crocs or Ufos for home footwear to accommodate orthotics.  INSTRUCTIONS: Please schedule a follow-up appointment in 6-8 weeks or sooner if any issues arise.                      Contains text generated by [employer].   Plantar Fasciitis (Heel Spur Syndrome) with Rehab The plantar fascia is a fibrous, ligament-like, soft-tissue structure that spans the bottom of the foot. Plantar fasciitis is a condition that causes pain in the foot due to inflammation of the tissue. SYMPTOMS  Pain and tenderness on the underneath side of the foot. Pain that worsens with standing or walking. CAUSES  Plantar fasciitis is caused by irritation and injury to the plantar fascia on the underneath side of the foot. Common mechanisms of injury include: Direct trauma to bottom of the foot. Damage to a small nerve that runs under the foot where the main fascia attaches to the heel bone. Stress placed on the plantar fascia due to bone spurs. RISK INCREASES WITH:  Activities that place stress on the plantar fascia (running, jumping, pivoting, or  cutting). Poor strength and flexibility. Improperly fitted shoes. Tight calf muscles. Flat feet. Failure to warm-up properly before activity. Obesity. PREVENTION Warm up and stretch properly before activity. Allow for adequate recovery between workouts. Maintain physical fitness: Strength, flexibility, and endurance. Cardiovascular fitness. Maintain a health body weight. Avoid stress on the plantar fascia. Wear properly fitted shoes, including arch supports for individuals who have flat feet.  PROGNOSIS  If treated properly, then the symptoms of plantar fasciitis usually resolve without surgery. However, occasionally surgery is necessary.  RELATED COMPLICATIONS  Recurrent symptoms that may result in a chronic condition. Problems of the lower back that are caused by compensating for the injury, such as limping. Pain or weakness of the foot during push-off following surgery. Chronic inflammation, scarring, and partial or complete fascia tear, occurring more often from repeated injections.  TREATMENT  Treatment initially involves the use of ice and medication to help reduce pain and inflammation. The use of strengthening and stretching exercises may help reduce pain with activity, especially stretches of the Achilles tendon. These exercises may be performed at home or with a therapist. Your caregiver may recommend that you use heel cups of arch supports to help reduce stress on the plantar fascia. Occasionally, corticosteroid injections are given to reduce inflammation. If symptoms persist for greater than 6 months despite non-surgical (conservative), then surgery may be recommended.   MEDICATION  If pain medication is necessary, then nonsteroidal anti-inflammatory medications, such as aspirin and ibuprofen , or other minor pain relievers, such as acetaminophen , are often recommended. Do not take pain medication within  7 days before surgery. Prescription pain relievers may be given if  deemed necessary by your caregiver. Use only as directed and only as much as you need. Corticosteroid injections may be given by your caregiver. These injections should be reserved for the most serious cases, because they may only be given a certain number of times.  HEAT AND COLD Cold treatment (icing) relieves pain and reduces inflammation. Cold treatment should be applied for 10 to 15 minutes every 2 to 3 hours for inflammation and pain and immediately after any activity that aggravates your symptoms. Use ice packs or massage the area with a piece of ice (ice massage). Heat treatment may be used prior to performing the stretching and strengthening activities prescribed by your caregiver, physical therapist, or athletic trainer. Use a heat pack or soak the injury in warm water .  SEEK IMMEDIATE MEDICAL CARE IF: Treatment seems to offer no benefit, or the condition worsens. Any medications produce adverse side effects.  EXERCISES- RANGE OF MOTION (ROM) AND STRETCHING EXERCISES - Plantar Fasciitis (Heel Spur Syndrome) These exercises may help you when beginning to rehabilitate your injury. Your symptoms may resolve with or without further involvement from your physician, physical therapist or athletic trainer. While completing these exercises, remember:  Restoring tissue flexibility helps normal motion to return to the joints. This allows healthier, less painful movement and activity. An effective stretch should be held for at least 30 seconds. A stretch should never be painful. You should only feel a gentle lengthening or release in the stretched tissue.  RANGE OF MOTION - Toe Extension, Flexion Sit with your right / left leg crossed over your opposite knee. Grasp your toes and gently pull them back toward the top of your foot. You should feel a stretch on the bottom of your toes and/or foot. Hold this stretch for 10 seconds. Now, gently pull your toes toward the bottom of your foot. You should  feel a stretch on the top of your toes and or foot. Hold this stretch for 10 seconds. Repeat  times. Complete this stretch 3 times per day.   RANGE OF MOTION - Ankle Dorsiflexion, Active Assisted Remove shoes and sit on a chair that is preferably not on a carpeted surface. Place right / left foot under knee. Extend your opposite leg for support. Keeping your heel down, slide your right / left foot back toward the chair until you feel a stretch at your ankle or calf. If you do not feel a stretch, slide your bottom forward to the edge of the chair, while still keeping your heel down. Hold this stretch for 10 seconds. Repeat 3 times. Complete this stretch 2 times per day.   STRETCH  Gastroc, Standing Place hands on wall. Extend right / left leg, keeping the front knee somewhat bent. Slightly point your toes inward on your back foot. Keeping your right / left heel on the floor and your knee straight, shift your weight toward the wall, not allowing your back to arch. You should feel a gentle stretch in the right / left calf. Hold this position for 10 seconds. Repeat 3 times. Complete this stretch 2 times per day.  STRETCH  Soleus, Standing Place hands on wall. Extend right / left leg, keeping the other knee somewhat bent. Slightly point your toes inward on your back foot. Keep your right / left heel on the floor, bend your back knee, and slightly shift your weight over the back leg so that you feel a gentle  stretch deep in your back calf. Hold this position for 10 seconds. Repeat 3 times. Complete this stretch 2 times per day.  STRETCH  Gastrocsoleus, Standing  Note: This exercise can place a lot of stress on your foot and ankle. Please complete this exercise only if specifically instructed by your caregiver.  Place the ball of your right / left foot on a step, keeping your other foot firmly on the same step. Hold on to the wall or a rail for balance. Slowly lift your other foot, allowing  your body weight to press your heel down over the edge of the step. You should feel a stretch in your right / left calf. Hold this position for 10 seconds. Repeat this exercise with a slight bend in your right / left knee. Repeat 3 times. Complete this stretch 2 times per day.   STRENGTHENING EXERCISES - Plantar Fasciitis (Heel Spur Syndrome)  These exercises may help you when beginning to rehabilitate your injury. They may resolve your symptoms with or without further involvement from your physician, physical therapist or athletic trainer. While completing these exercises, remember:  Muscles can gain both the endurance and the strength needed for everyday activities through controlled exercises. Complete these exercises as instructed by your physician, physical therapist or athletic trainer. Progress the resistance and repetitions only as guided.  STRENGTH - Towel Curls Sit in a chair positioned on a non-carpeted surface. Place your foot on a towel, keeping your heel on the floor. Pull the towel toward your heel by only curling your toes. Keep your heel on the floor. Repeat 3 times. Complete this exercise 2 times per day.  STRENGTH - Ankle Inversion Secure one end of a rubber exercise band/tubing to a fixed object (table, pole). Loop the other end around your foot just before your toes. Place your fists between your knees. This will focus your strengthening at your ankle. Slowly, pull your big toe up and in, making sure the band/tubing is positioned to resist the entire motion. Hold this position for 10 seconds. Have your muscles resist the band/tubing as it slowly pulls your foot back to the starting position. Repeat 3 times. Complete this exercises 2 times per day.  Document Released: 01/31/2005 Document Revised: 04/25/2011 Document Reviewed: 05/15/2008 [facility] Patient Information 2014 [place], [place].

## 2023-08-16 NOTE — Progress Notes (Signed)
 Subjective:  Patient ID: Keith Osborne, male    DOB: 1970-07-02,  MRN: [id number]  Chief Complaint  Patient presents with   Plantar Fasciitis    Rm6  Plantar fasciitis right foot started 3 months ago/sharp aching and hurts with pressure/ custom orthotics and shoe gear changes.    Discussed the use of AI scribe software for clinical note transcription with the patient, who gave verbal consent to proceed.  History of Present Illness Keith Osborne is a 53 year old male with plantar fasciitis who presents with recurrent heel pain.  He experiences heel pain primarily in the heel and the outside of his foot. He has a history of plantar fasciitis and underwent surgery in 2020, which initially provided relief. However, the pain has recently recurred.  He is preparing for a vacation and is concerned about walking on sand, as it exacerbates his pain. 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  He has tried various orthotics and footwear solutions, including visiting a [employer] store, but finds that the orthotics only work when he wears shoes constantly, which he dislikes doing at home. He has attempted to use orthotics in flip flops, but found them uncomfortable.  He has been using meloxicam in the past but weaned himself off due to long-term use concerns. He has also received cortisone injections previously, which provided temporary relief. He is currently not taking any medications for his heel pain.  No allergies to medications except for Flexeril. No recent injuries or new symptoms aside from the heel pain.      Objective:    Physical Exam VASCULAR: DP and PT pulse palpable. Foot is warm and well-perfused. Capillary fill time is brisk. DERMATOLOGIC: Normal skin turgor texture and temperature. No open lesions or rashes or ulcerations. NEUROLOGIC: Normal sensation to light touch and pressure. No  paresthesias on examination. ORTHOPEDIC: Sharp pain on palpation of plantar medial heel and lateral band. Smooth pain-free range of motion of all examined joints. No ecchymosis or bruising. No gross deformity.   No images are attached to the encounter.    Results Procedure: Corticosteroid injection Description: The right heel was injected with 20 mg of Kenalog , 4 mg of dexamethasone , and 5 mg of Marcaine . The injection site was prepped with alcohol and dressed with a bandage. A home therapy plan was given. A plantar fascial offloading splint was applied.  RADIOLOGY Right foot X-ray: Prominence of the inferior calcaneus, no discrete spurring, no evidence of fracture or stress fracture (08/16/2023)   Assessment:   1. Plantar fasciitis of right foot      Plan:  Patient was evaluated and treated and all questions answered.  Assessment and Plan Assessment & Plan Plantar fasciitis Chronic plantar fasciitis with recent exacerbation, primarily affecting the right heel and lateral foot. Previous surgical intervention in 2020 for plantar fascia release. Current symptoms include constant pain exacerbated by walking on hard surfaces and barefoot. X-ray shows prominence of the inferior calcaneus but no discrete spurring. Pain likely due to compensation and weight transfer to the lateral foot. - Administer corticosteroid injection to the right heel with 20 mg of Kenalog , 4 mg of dexamethasone , and 5 mg of Marcaine . Informed consent obtained, explaining temporary relief and potential diminished effectiveness with repeated use. - Prescribe meloxicam for 30 days with three refills, to be used PRN after one month. - Provide home therapy plan with exercises. - Apply  plantar fascial offloading splint for support and soft tissue rest. - Recommend trying Crocs or Oofos for home footwear to accommodate orthotics. - Schedule follow-up appointment in 6-8 weeks or sooner if issues arise.      Return in  about 6 weeks (around 09/27/2023) for recheck plantar fasciitis.

## 2023-09-18 ENCOUNTER — Encounter (HOSPITAL_COMMUNITY): Payer: Self-pay | Admitting: *Deleted

## 2023-09-18 ENCOUNTER — Emergency Department (HOSPITAL_COMMUNITY): Payer: Self-pay

## 2023-09-18 ENCOUNTER — Emergency Department (HOSPITAL_COMMUNITY)
Admission: EM | Admit: 2023-09-18 | Discharge: 2023-09-19 | Disposition: A | Discharge status: Home / Self Care | Payer: Worker's Compensation | Attending: Emergency Medicine | Admitting: Emergency Medicine

## 2023-09-18 ENCOUNTER — Other Ambulatory Visit: Payer: Self-pay

## 2023-09-18 DIAGNOSIS — Y93B9 Activity, other involving muscle strengthening exercises: Secondary | ICD-10-CM | POA: Insufficient documentation

## 2023-09-18 DIAGNOSIS — Z7982 Long term (current) use of aspirin: Secondary | ICD-10-CM | POA: Insufficient documentation

## 2023-09-18 DIAGNOSIS — S299XXA Unspecified injury of thorax, initial encounter: Secondary | ICD-10-CM | POA: Diagnosis present

## 2023-09-18 DIAGNOSIS — W500XXA Accidental hit or strike by another person, initial encounter: Secondary | ICD-10-CM | POA: Diagnosis not present

## 2023-09-18 DIAGNOSIS — Y9284 Military training ground as the place of occurrence of the external cause: Secondary | ICD-10-CM | POA: Diagnosis not present

## 2023-09-18 MED ORDER — DICLOFENAC EPOLAMINE 1.3 % EX PTCH
1.0000 | MEDICATED_PATCH | Freq: Two times a day (BID) | CUTANEOUS | Status: DC
  Administered 2023-09-18: 1 via TRANSDERMAL
  Filled 2023-09-18: qty 1

## 2023-09-18 MED ORDER — KETOROLAC TROMETHAMINE 30 MG/ML IJ SOLN
15.0000 mg | Freq: Once | INTRAMUSCULAR | Status: AC
  Administered 2023-09-18: 15 mg via INTRAMUSCULAR
  Filled 2023-09-18: qty 1

## 2023-09-18 NOTE — Discharge Instructions (Addendum)
 Ibuprofen  and diclofenac  are appropriate for pain control.  Return here for concerning changes in your condition.

## 2023-09-18 NOTE — ED Provider Notes (Signed)
 [place] EMERGENCY DEPARTMENT AT [facility] Provider Note   CSN: [id number] Arrival date & time: 09/18/23  1705     Patient presents with: Rib Injury   Keith Osborne is a 53 y.o. male.   HPI Patient presents after sustaining blunt trauma to the left anterior chest.  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  While performing an exercise patient was kneed in the rib cage.  Since that time he had severe pain with motion, breathing, activity in the left anterior lateral midclavicular line.  No fever, syncope or other complaints.    Prior to Admission medications   Medication Sig Start Date End Date Taking? Authorizing Provider  aspirin 81 MG chewable tablet Chew by mouth daily.    [provider]  aspirin-acetaminophen -caffeine (MIGRAINE FORMULA) 250-250-65 MG per tablet As directed by package.    [provider]  cetirizine-pseudoephedrine (ZYRTEC-D) 5-120 MG per tablet Take by mouth.    [provider]  Dulaglutide (TRULICITY) 0.75 MG/0.5ML SOPN Inject 0.75 mg into the skin 7 days.    [provider]  esomeprazole (NEXIUM) 40 MG capsule Take 40 mg by mouth daily at 12 noon.    [provider]  FIBER PO Take 1 tablet by mouth daily.    [provider]  ibuprofen  (MOTRIN  IB) 200 MG tablet Take 3 tablets (600 mg total) by mouth every 6 (six) hours as needed. 01/21/16   Keith Osborne LABOR, MD  lisinopril-hydrochlorothiazide (PRINZIDE,ZESTORETIC) 20-25 MG tablet Take 1 tablet by mouth daily.    [provider]  MEGARED OMEGA-3 KRILL OIL 500 MG CAPS Take by mouth. Patient not taking: Reported on 03/09/2022    [provider]  meloxicam  (MOBIC ) 15 MG tablet Take 1 tablet (15 mg total) by mouth daily. 08/16/23   Keith Osborne, DPM  ondansetron  (ZOFRAN ) 4 MG tablet Take 1 tablet (4 mg total) by mouth every 8 (eight) hours as needed for nausea or vomiting. Patient not taking: Reported on 02/23/2018 01/21/16    Keith Osborne LABOR, MD  oxyCODONE -acetaminophen  (PERCOCET) 5-325 MG tablet Take 1-2 tablets by mouth every 6 (six) hours as needed for severe pain. 03/07/18   Keith Osborne, DPM  oxyCODONE -acetaminophen  (ROXICET) 5-325 MG tablet Take 1 tablet by mouth every 6 (six) hours as needed. Patient not taking: Reported on 02/23/2018 01/21/16   Keith Osborne LABOR, MD  propranolol (INDERAL) 40 MG tablet Take 40 mg by mouth 2 (two) times daily.  05/01/13   [provider]  tamsulosin  (FLOMAX ) 0.4 MG CAPS capsule Take 1 capsule (0.4 mg total) by mouth daily. 01/21/16   Keith Osborne LABOR, MD    Allergies: Cyclobenzaprine    Review of Systems  Updated Vital Signs BP 108/63 (BP Location: Right Arm)   Pulse 95   Temp 98.5 F (36.9 C)   Resp 18   Ht 5' 5 (1.651 m)   Wt 113.3 kg   SpO2 97%   BMI 41.57 kg/m   Physical Exam Vitals and nursing note reviewed.  Constitutional:      General: He is not in acute distress.    Appearance: He is well-developed. He is obese.  HENT:     Head: Normocephalic and atraumatic.  Eyes:     Conjunctiva/sclera: Conjunctivae normal.  Cardiovascular:     Rate and Rhythm: Normal rate and regular rhythm.  Pulmonary:     Effort: Pulmonary effort is normal. No respiratory distress.     Breath sounds: No stridor.  Chest:    Abdominal:     General: There is no distension.  Skin:    General: Skin is warm and dry.  Neurological:     Mental Status: He is alert and oriented to person, place, and time.     (all labs ordered are listed, but only abnormal results are displayed) Labs Reviewed - No data to display  EKG: None  Radiology: DG Ribs Unilateral W/Chest Left Result Date: 09/18/2023 CLINICAL DATA:  Pain after injury. EXAM: LEFT RIBS AND CHEST - 3+ VIEW COMPARISON:  None Available. FINDINGS: No fracture or other bone lesions are seen involving the ribs. There is no pneumothorax or pleural effusion. Both lungs are clear. Heart size and mediastinal contours  are within normal limits. IMPRESSION: Negative radiographs of the chest and left ribs. Electronically Signed   By: Keith Osborne M.D.   On: 09/18/2023 18:19     Procedures   Medications Ordered in the ED  diclofenac  (FLECTOR ) 1.3 % 1 patch (1 patch Transdermal Patch Applied 09/18/23 2309)  ketorolac  (TORADOL ) 30 MG/ML injection 15 mg (15 mg Intramuscular Given 09/18/23 2214)                                    Medical Decision Making Adult male presents after sustaining injury while performing an exercise at work.  Concern for rib fracture versus pulmonary contusion.  X-ray from triage demonstrated at bedside nondiagnostic, CT scan ordered, Toradol  ordered. Pulse ox 98% room air normal  Amount and/or Complexity of Data Reviewed Radiology: ordered and independent interpretation performed. Decision-making details documented in ED Course.  Risk Prescription drug management.  CT reviewed: 11:26 PM Patient in no distress, awake, alert, awaiting final CT interpretation.  My interpretation, no pneumothorax, no obvious fracture, patient started topical anti-inflammatories, oral as well, follow-up with occupational health.     Final diagnoses:  Rib injury    ED Discharge Orders     None          Keith Charleston, MD 09/18/23 2326

## 2023-09-18 NOTE — ED Triage Notes (Signed)
 Pt was in training involving physical on mats, had co-worker accidentally injured to left ribs. Hurts to take a deep breath.

## 2023-09-27 ENCOUNTER — Ambulatory Visit: Admitting: Podiatry

## 2023-10-10 ENCOUNTER — Other Ambulatory Visit: Payer: Self-pay | Admitting: Urology

## 2023-10-10 DIAGNOSIS — N401 Enlarged prostate with lower urinary tract symptoms: Secondary | ICD-10-CM

## 2023-10-27 NOTE — Progress Notes (Signed)
 Chief Complaint: Patient was seen in consultation today for benign prostatic hyperplasia with lower urinary tract symptoms.   Referring Physician(s): Herrick,Benjamin W  History of Present Illness: Keith Osborne is a 53 y.o. male with a medical history significant for HTN, migraines, DM and benign prostatic hyperplasia with lower urinary tract symptoms. He has a history of elevated PSA with two prostate biopsies negative for malignancy. The patient struggles intermittently with weak streak and sense of incomplete bladder emptying. He takes Tamsulosin  and finasteride with some improvement in his symptoms.   At his last Urology visit he complained of worsening frequency and urgency. He is ready to pursue treatment and Dr. Cam discussed prostate artery embolization and reviewed the procedure details. The patient expressed interest and he has been kindly referred to Interventional Radiology for further discussion.   His main complaints are incomplete emptying and urgency.  He has never had an acute episode of urinary retention.  No UTIs or hematuria.     Past Medical History:  Diagnosis Date   Acid reflux    Arthritis    Diabetes mellitus without complication (HCC)    Hx of dysplastic nevus 05/25/2016   R top of shoulder   Hypertension    Migraines    Neuromuscular disorder (HCC)    Sleep apnea    uses CPAP    Past Surgical History:  Procedure Laterality Date   COLONOSCOPY WITH PROPOFOL  N/A 03/18/2022   Procedure: COLONOSCOPY WITH PROPOFOL ;  Surgeon: Jinny Carmine, MD;  Location: [facility];  Service: Endoscopy;  Laterality: N/A;   FOOT SURGERY Left    NERVE REPAIR Right 03/07/2018   Procedure: Baxter's Release;  Surgeon: Ashley Soulier, DPM;  Location: [facility];  Service: Podiatry;  Laterality: Right;   PLANTAR FASCIA RELEASE Right 03/07/2018   Procedure: Endoscopic Plantar Fasc. Release Right;  Surgeon: Ashley Soulier, DPM;  Location: [facility];  Service: Podiatry;  Laterality: Right;  general with local   POLYPECTOMY  03/18/2022   Procedure: POLYPECTOMY;  Surgeon: Jinny Carmine, MD;  Location: [facility];  Service: Endoscopy;;   TONSILLECTOMY     53 y.o.   WISDOM TOOTH EXTRACTION      Allergies: Cyclobenzaprine  Medications: Prior to Admission medications   Medication Sig Start Date End Date Taking? Authorizing Provider  aspirin 81 MG chewable tablet Chew by mouth daily.    [provider]  aspirin-acetaminophen -caffeine (MIGRAINE FORMULA) 250-250-65 MG per tablet As directed by package.    [provider]  cetirizine-pseudoephedrine (ZYRTEC-D) 5-120 MG per tablet Take by mouth.    [provider]  Dulaglutide (TRULICITY) 0.75 MG/0.5ML SOPN Inject 0.75 mg into the skin 7 days.    [provider]  esomeprazole (NEXIUM) 40 MG capsule Take 40 mg by mouth daily at 12 noon.    [provider]  FIBER PO Take 1 tablet by mouth daily.    [provider]  ibuprofen  (MOTRIN  IB) 200 MG tablet Take 3 tablets (600 mg total) by mouth every 6 (six) hours as needed. 01/21/16   Harlee Lynwood LABOR, MD  lisinopril-hydrochlorothiazide (PRINZIDE,ZESTORETIC) 20-25 MG tablet Take 1 tablet by mouth daily.    [provider]  MEGARED OMEGA-3 KRILL OIL 500 MG CAPS Take by mouth. Patient not taking: Reported on 03/09/2022    [provider]  meloxicam  (MOBIC ) 15 MG tablet Take 1 tablet (15 mg total) by mouth daily. 08/16/23   McDonald, Juliene SAUNDERS, DPM  ondansetron  (ZOFRAN ) 4 MG  tablet Take 1 tablet (4 mg total) by mouth every 8 (eight) hours as needed for nausea or vomiting. Patient not taking: Reported on 02/23/2018 01/21/16   Harlee Lynwood LABOR, MD  oxyCODONE -acetaminophen  (PERCOCET) 5-325 MG tablet Take 1-2 tablets by mouth every 6 (six) hours as needed for severe pain. 03/07/18   Ashley Soulier, DPM  oxyCODONE -acetaminophen  (ROXICET) 5-325 MG tablet Take 1 tablet by mouth every 6  (six) hours as needed. Patient not taking: Reported on 02/23/2018 01/21/16   Harlee Lynwood LABOR, MD  propranolol (INDERAL) 40 MG tablet Take 40 mg by mouth 2 (two) times daily.  05/01/13   [provider]  tamsulosin  (FLOMAX ) 0.4 MG CAPS capsule Take 1 capsule (0.4 mg total) by mouth daily. 01/21/16   Harlee Lynwood LABOR, MD     Family History  Problem Relation Age of Onset   Breast cancer Maternal Aunt     Social History   Socioeconomic History   Marital status: Married    Spouse name: Not on file   Number of children: Not on file   Years of education: Not on file   Highest education level: Not on file  Occupational History   Not on file  Tobacco Use   Smoking status: Never   Smokeless tobacco: Never  Substance and Sexual Activity   Alcohol use: Yes    Comment: occ.   Drug use: No   Sexual activity: Not on file  Other Topics Concern   Not on file  Social History Narrative   Not on file   Social Drivers of Health   Financial Resource Strain: Low Risk  (11/15/2022)   Received from [facility]   Overall Financial Resource Strain (CARDIA)    Difficulty of Paying Living Expenses: Not very hard  Food Insecurity: No Food Insecurity (11/15/2022)   Received from [facility]   Hunger Vital Sign    Within the past 12 months, you worried that your food would run out before you got the money to buy more.: Never true    Within the past 12 months, the food you bought just didn't last and you didn't have money to get more.: Never true  Transportation Needs: No Transportation Needs (11/15/2022)   Received from [facility]    In the past 12 months, has lack of transportation kept you from medical appointments or from getting medications?: No    Lack of Transportation (Non-Medical): No  Physical Activity: Not on file  Stress: Not on file  Social Connections: Not on file    Review of Systems: A 12 point  ROS discussed and pertinent positives are indicated in the HPI above.  All other systems are negative.  Vital Signs: There were no vitals taken for this visit.  Physical Exam Constitutional:      General: He is not in acute distress. HENT:     Head: Normocephalic.     Nose: Nose normal.  Eyes:     General: No scleral icterus. Cardiovascular:     Rate and Rhythm: Normal rate and regular rhythm.  Pulmonary:     Effort: No respiratory distress.  Abdominal:     General: There is no distension.  Musculoskeletal:     Right lower leg: No edema.     Left lower leg: No edema.  Skin:    General: Skin is warm and dry.  Neurological:     Mental Status: He is alert and oriented  to person, place, and time.     Imaging:  MR Prostate 04/12/22   Prostate volume 95.96 cc (6.3 by 5.2 by 6.0 cm)  Labs:  CBC: No results for input(s): WBC, HGB, HCT, PLT in the last 8760 hours.  COAGS: No results for input(s): INR, APTT in the last 8760 hours.  BMP: No results for input(s): NA, K, CL, CO2, GLUCOSE, BUN, CALCIUM, CREATININE, GFRNONAA, GFRAA in the last 8760 hours.  Invalid input(s): CMP  LIVER FUNCTION TESTS: No results for input(s): BILITOT, AST, ALT, ALKPHOS, PROT, ALBUMIN in the last 8760 hours.  TUMOR MARKERS: No results for input(s): AFPTM, CEA, CA199, CHROMGRNA in the last 8760 hours.  Assessment and Plan: 53 year old male with a history of benign prostatic hyperplasia (96 g) with moderate lower urinary tract symptoms (IPSS-QoL 18-5).  He would be an excellent candidate for prostate artery embolization.  We discussed the rationale, periprocedural expectations, and long term expected outcomes of prostate artery embolization.  He would like to proceed.  -obtain CTA pelvis prior to procedure -plan for prostate artery embolization via left radial artery access at [facility], moderate sedation.    Ester Sides,  MD Pager: [phone]    I spent a total of  40 Minutes   in face to face in clinical consultation, greater than 50% of which was counseling/coordinating care for benign prostatic hyperplasia with lower urinary tract symptoms.

## 2023-10-30 ENCOUNTER — Other Ambulatory Visit: Payer: Self-pay | Admitting: Interventional Radiology

## 2023-10-30 ENCOUNTER — Ambulatory Visit
Admission: RE | Admit: 2023-10-30 | Discharge: 2023-10-30 | Disposition: A | Discharge status: Home / Self Care | Payer: Self-pay | Source: Ambulatory Visit | Attending: Urology | Admitting: Urology

## 2023-10-30 DIAGNOSIS — N400 Enlarged prostate without lower urinary tract symptoms: Secondary | ICD-10-CM

## 2023-10-30 DIAGNOSIS — N401 Enlarged prostate with lower urinary tract symptoms: Secondary | ICD-10-CM

## 2023-10-30 HISTORY — PX: IR RADIOLOGIST EVAL & MGMT: IMG5224

## 2023-11-01 ENCOUNTER — Other Ambulatory Visit (HOSPITAL_COMMUNITY): Payer: Self-pay | Admitting: Interventional Radiology

## 2023-11-01 ENCOUNTER — Encounter: Payer: Self-pay | Admitting: Interventional Radiology

## 2023-11-01 ENCOUNTER — Telehealth (HOSPITAL_COMMUNITY): Payer: Self-pay | Admitting: Radiology

## 2023-11-01 DIAGNOSIS — N401 Enlarged prostate with lower urinary tract symptoms: Secondary | ICD-10-CM

## 2023-11-01 NOTE — Telephone Encounter (Signed)
 Called pt to schedule PAE with Dr. Jennefer on 10/9 at 12 pm. Left VM for him to call back. JM

## 2023-11-08 ENCOUNTER — Inpatient Hospital Stay
Admission: RE | Admit: 2023-11-08 | Discharge: 2023-11-08 | Disposition: A | Discharge status: Home / Self Care | Source: Ambulatory Visit | Attending: Interventional Radiology

## 2023-11-08 DIAGNOSIS — N400 Enlarged prostate without lower urinary tract symptoms: Secondary | ICD-10-CM

## 2023-11-08 MED ORDER — IOPAMIDOL (ISOVUE-300) INJECTION 61%
75.0000 mL | Freq: Once | INTRAVENOUS | Status: AC | PRN
Start: 2023-11-08 — End: 2023-11-08
  Administered 2023-11-08: 75 mL via INTRAVENOUS

## 2023-11-15 ENCOUNTER — Other Ambulatory Visit: Payer: Self-pay

## 2023-11-15 ENCOUNTER — Inpatient Hospital Stay
Admission: EM | Admit: 2023-11-15 | Discharge: 2023-11-17 | DRG: 872 | Disposition: A | Discharge status: Home / Self Care | Attending: Osteopathic Medicine | Admitting: Osteopathic Medicine

## 2023-11-15 ENCOUNTER — Encounter: Payer: Self-pay | Admitting: Intensive Care

## 2023-11-15 ENCOUNTER — Emergency Department

## 2023-11-15 DIAGNOSIS — E119 Type 2 diabetes mellitus without complications: Secondary | ICD-10-CM | POA: Diagnosis present

## 2023-11-15 DIAGNOSIS — Z79899 Other long term (current) drug therapy: Secondary | ICD-10-CM | POA: Diagnosis not present

## 2023-11-15 DIAGNOSIS — L732 Hidradenitis suppurativa: Secondary | ICD-10-CM | POA: Diagnosis present

## 2023-11-15 DIAGNOSIS — Z7982 Long term (current) use of aspirin: Secondary | ICD-10-CM | POA: Diagnosis not present

## 2023-11-15 DIAGNOSIS — N410 Acute prostatitis: Secondary | ICD-10-CM | POA: Diagnosis present

## 2023-11-15 DIAGNOSIS — Z7985 Long-term (current) use of injectable non-insulin antidiabetic drugs: Secondary | ICD-10-CM | POA: Diagnosis not present

## 2023-11-15 DIAGNOSIS — N390 Urinary tract infection, site not specified: Secondary | ICD-10-CM | POA: Diagnosis present

## 2023-11-15 DIAGNOSIS — Z6841 Body Mass Index (BMI) 40.0 and over, adult: Secondary | ICD-10-CM | POA: Diagnosis not present

## 2023-11-15 DIAGNOSIS — Z803 Family history of malignant neoplasm of breast: Secondary | ICD-10-CM | POA: Diagnosis not present

## 2023-11-15 DIAGNOSIS — G4733 Obstructive sleep apnea (adult) (pediatric): Secondary | ICD-10-CM | POA: Diagnosis present

## 2023-11-15 DIAGNOSIS — K219 Gastro-esophageal reflux disease without esophagitis: Secondary | ICD-10-CM | POA: Diagnosis present

## 2023-11-15 DIAGNOSIS — I959 Hypotension, unspecified: Secondary | ICD-10-CM | POA: Diagnosis present

## 2023-11-15 DIAGNOSIS — Z23 Encounter for immunization: Secondary | ICD-10-CM | POA: Diagnosis not present

## 2023-11-15 DIAGNOSIS — R3915 Urgency of urination: Secondary | ICD-10-CM | POA: Diagnosis present

## 2023-11-15 DIAGNOSIS — A419 Sepsis, unspecified organism: Secondary | ICD-10-CM | POA: Diagnosis present

## 2023-11-15 DIAGNOSIS — I10 Essential (primary) hypertension: Secondary | ICD-10-CM | POA: Diagnosis present

## 2023-11-15 DIAGNOSIS — N400 Enlarged prostate without lower urinary tract symptoms: Secondary | ICD-10-CM | POA: Diagnosis present

## 2023-11-15 DIAGNOSIS — R6520 Severe sepsis without septic shock: Secondary | ICD-10-CM | POA: Diagnosis present

## 2023-11-15 DIAGNOSIS — E66813 Obesity, class 3: Secondary | ICD-10-CM | POA: Diagnosis present

## 2023-11-15 DIAGNOSIS — Z791 Long term (current) use of non-steroidal anti-inflammatories (NSAID): Secondary | ICD-10-CM

## 2023-11-15 DIAGNOSIS — R310 Gross hematuria: Secondary | ICD-10-CM | POA: Diagnosis present

## 2023-11-15 HISTORY — DX: Psoriasis, unspecified: L40.9

## 2023-11-15 LAB — LACTIC ACID, PLASMA: Lactic Acid, Venous: 1.5 mmol/L (ref 0.5–1.9)

## 2023-11-15 LAB — CBC WITH DIFFERENTIAL/PLATELET
Abs Immature Granulocytes: 0.07 K/uL (ref 0.00–0.07)
Basophils Absolute: 0 K/uL (ref 0.0–0.1)
Basophils Relative: 0 %
Eosinophils Absolute: 0.1 K/uL (ref 0.0–0.5)
Eosinophils Relative: 1 %
HCT: 44 % (ref 39.0–52.0)
Hemoglobin: 14.8 g/dL (ref 13.0–17.0)
Immature Granulocytes: 0 %
Lymphocytes Relative: 6 %
Lymphs Abs: 0.9 K/uL (ref 0.7–4.0)
MCH: 29.5 pg (ref 26.0–34.0)
MCHC: 33.6 g/dL (ref 30.0–36.0)
MCV: 87.6 fL (ref 80.0–100.0)
Monocytes Absolute: 0.4 K/uL (ref 0.1–1.0)
Monocytes Relative: 2 %
Neutro Abs: 14.3 K/uL — ABNORMAL HIGH (ref 1.7–7.7)
Neutrophils Relative %: 91 %
Platelets: 262 K/uL (ref 150–400)
RBC: 5.02 MIL/uL (ref 4.22–5.81)
RDW: 12.4 % (ref 11.5–15.5)
WBC: 15.9 K/uL — ABNORMAL HIGH (ref 4.0–10.5)
nRBC: 0 % (ref 0.0–0.2)

## 2023-11-15 LAB — URINALYSIS, ROUTINE W REFLEX MICROSCOPIC
Bilirubin Urine: NEGATIVE
Glucose, UA: NEGATIVE mg/dL
Ketones, ur: NEGATIVE mg/dL
Nitrite: POSITIVE — AB
Protein, ur: 100 mg/dL — AB
Specific Gravity, Urine: 1.021 (ref 1.005–1.030)
Squamous Epithelial / HPF: 0 /HPF (ref 0–5)
WBC, UA: >50 WBC/hpf (ref 0–5)
pH: 5 (ref 5.0–8.0)

## 2023-11-15 LAB — COMPREHENSIVE METABOLIC PANEL WITH GFR
ALT: 51 U/L — ABNORMAL HIGH (ref 0–44)
AST: 36 U/L (ref 15–41)
Albumin: 4.3 g/dL (ref 3.5–5.0)
Alkaline Phosphatase: 68 U/L (ref 38–126)
Anion gap: 13 (ref 5–15)
BUN: 21 mg/dL — ABNORMAL HIGH (ref 6–20)
CO2: 24 mmol/L (ref 22–32)
Calcium: 9.3 mg/dL (ref 8.9–10.3)
Chloride: 98 mmol/L (ref 98–111)
Creatinine, Ser: 0.89 mg/dL (ref 0.61–1.24)
GFR, Estimated: >60 mL/min (ref >60)
Glucose, Bld: 118 mg/dL — ABNORMAL HIGH (ref 70–99)
Potassium: 3.7 mmol/L (ref 3.5–5.1)
Sodium: 135 mmol/L (ref 135–145)
Total Bilirubin: 0.8 mg/dL (ref 0.0–1.2)
Total Protein: 7.9 g/dL (ref 6.5–8.1)

## 2023-11-15 MED ORDER — SODIUM CHLORIDE 0.9 % IV SOLN
1.0000 g | INTRAVENOUS | Status: DC
  Administered 2023-11-16: 1 g via INTRAVENOUS
  Filled 2023-11-15 (×2): qty 10

## 2023-11-15 MED ORDER — SODIUM CHLORIDE 0.9 % IV SOLN
2.0000 g | Freq: Once | INTRAVENOUS | Status: AC
  Administered 2023-11-15: 2 g via INTRAVENOUS
  Filled 2023-11-15: qty 20

## 2023-11-15 MED ORDER — ENOXAPARIN SODIUM 60 MG/0.6ML IJ SOSY
55.0000 mg | PREFILLED_SYRINGE | INTRAMUSCULAR | Status: DC
  Administered 2023-11-16 – 2023-11-17 (×2): 55 mg via SUBCUTANEOUS
  Filled 2023-11-15 (×2): qty 0.6

## 2023-11-15 MED ORDER — KETOROLAC TROMETHAMINE 30 MG/ML IJ SOLN
30.0000 mg | Freq: Once | INTRAMUSCULAR | Status: AC
  Administered 2023-11-15: 30 mg via INTRAVENOUS
  Filled 2023-11-15: qty 1

## 2023-11-15 MED ORDER — SODIUM CHLORIDE 0.9 % IV BOLUS
1000.0000 mL | Freq: Once | INTRAVENOUS | Status: AC
  Administered 2023-11-15: 1000 mL via INTRAVENOUS

## 2023-11-15 MED ORDER — LACTATED RINGERS IV BOLUS (SEPSIS)
1000.0000 mL | Freq: Once | INTRAVENOUS | Status: AC
  Administered 2023-11-15: 1000 mL via INTRAVENOUS

## 2023-11-15 MED ORDER — ASPIRIN 81 MG PO TBEC
81.0000 mg | DELAYED_RELEASE_TABLET | Freq: Every day | ORAL | Status: DC
  Administered 2023-11-15 – 2023-11-16 (×2): 81 mg via ORAL
  Filled 2023-11-15 (×2): qty 1

## 2023-11-15 MED ORDER — INSULIN ASPART 100 UNIT/ML IJ SOLN: 0.0000 [IU] | Freq: Three times a day (TID) | INTRAMUSCULAR | Status: DC

## 2023-11-15 MED ORDER — FINASTERIDE 5 MG PO TABS
5.0000 mg | ORAL_TABLET | Freq: Every day | ORAL | Status: DC
  Administered 2023-11-16 – 2023-11-17 (×2): 5 mg via ORAL
  Filled 2023-11-15 (×2): qty 1

## 2023-11-15 MED ORDER — LACTATED RINGERS IV SOLN: INTRAVENOUS | Status: DC

## 2023-11-15 MED ORDER — SODIUM CHLORIDE 0.9 % IV SOLN: INTRAVENOUS | Status: DC

## 2023-11-15 MED ORDER — PANTOPRAZOLE SODIUM 40 MG PO TBEC
40.0000 mg | DELAYED_RELEASE_TABLET | Freq: Every day | ORAL | Status: DC
  Administered 2023-11-15 – 2023-11-16 (×2): 40 mg via ORAL
  Filled 2023-11-15 (×2): qty 1

## 2023-11-15 MED ORDER — ONDANSETRON HCL 4 MG/2ML IJ SOLN
4.0000 mg | Freq: Once | INTRAMUSCULAR | Status: AC
  Administered 2023-11-15: 4 mg via INTRAVENOUS
  Filled 2023-11-15: qty 2

## 2023-11-15 NOTE — ED Notes (Signed)
 Called CCMD for monitoring

## 2023-11-15 NOTE — Progress Notes (Signed)
 PHARMACIST - PHYSICIAN COMMUNICATION  CONCERNING:  Enoxaparin (Lovenox) for DVT Prophylaxis    RECOMMENDATION: Patient was prescribed enoxaprin 40mg  q24 hours for VTE prophylaxis.   Filed Weights   11/15/23 1421  Weight: 109.8 kg (242 lb)    Body mass index is 40.27 kg/m.  Estimated Creatinine Clearance: 111 mL/min (by C-G formula based on SCr of 0.89 mg/dL).   Based on [facility] policy patient is candidate for enoxaparin 0.5mg /kg TBW SQ every 24 hours based on BMI being >30.  DESCRIPTION: Pharmacy has adjusted enoxaparin dose per [facility] policy.  Patient is now receiving enoxaparin 0.5 mg/kg every 24 hours   Rankin CANDIE Dills, PharmD, [facility] 11/15/2023 10:16 PM

## 2023-11-15 NOTE — ED Provider Notes (Signed)
 [facility] Emergency Department Provider Note     Event Date/Time   First MD Initiated Contact with Patient 11/15/23 1549     (approximate)   History   Flank Pain   HPI  Keith Osborne is a 53 y.o. male with a history of HTN, DM type II, migraines, obesity, kidney stones and OSA, presents to the ED endorsing bilateral flank pain as well as penis pain.  He reports difficulty passing urine patient also reports chills without frank fevers.  He does give a history of kidney stones.   Physical Exam   Triage Vital Signs: ED Triage Vitals  Encounter Vitals Group     BP 11/15/23 1420 136/87     Girls Systolic BP Percentile --      Girls Diastolic BP Percentile --      Boys Systolic BP Percentile --      Boys Diastolic BP Percentile --      Pulse Rate 11/15/23 1420 (!) 106     Resp 11/15/23 1420 18     Temp 11/15/23 1420 98.2 F (36.8 C)     Temp Source 11/15/23 1420 Oral     SpO2 11/15/23 1420 100 %     Weight 11/15/23 1421 242 lb (109.8 kg)     Height 11/15/23 1421 5' 5 (1.651 m)     Head Circumference --      Peak Flow --      Pain Score 11/15/23 1421 10     Pain Loc --      Pain Education --      Exclude from Growth Chart --     Most recent vital signs: Vitals:   11/15/23 1830 11/15/23 1911  BP: (!) 80/61 (!) 87/59  Pulse: (!) 115 (!) 117  Resp: 18 18  Temp:    SpO2: 94% 98%    General Awake, no distress. NAD HEENT NCAT. PERRL. EOMI. No rhinorrhea. Mucous membranes are moist.  CV:  Good peripheral perfusion.  RESP:  Normal effort.  ABD:  No distention.  Soft and nontender.  No rebound, guarding, or rigidity noted.  No CVA tenderness elicited.   ED Results / Procedures / Treatments   Labs (all labs ordered are listed, but only abnormal results are displayed) Labs Reviewed  CBC WITH DIFFERENTIAL/PLATELET - Abnormal; Notable for the following components:      Result Value   WBC 15.9 (*)    Neutro Abs 14.3 (*)    All other  components within normal limits  COMPREHENSIVE METABOLIC PANEL WITH GFR - Abnormal; Notable for the following components:   Glucose, Bld 118 (*)    BUN 21 (*)    ALT 51 (*)    All other components within normal limits  URINALYSIS, ROUTINE W REFLEX MICROSCOPIC - Abnormal; Notable for the following components:   Color, Urine YELLOW (*)    APPearance CLOUDY (*)    Hgb urine dipstick SMALL (*)    Protein, ur 100 (*)    Nitrite POSITIVE (*)    Leukocytes,Ua LARGE (*)    Bacteria, UA RARE (*)    All other components within normal limits  URINE CULTURE  CULTURE, BLOOD (SINGLE)  LACTIC ACID, PLASMA     EKG  Vent. rate 122 BPM PR interval 144 ms QRS duration 78 ms QT/QTcB 282/401 ms P-R-T axes 68 -60 79 Sinus tachycardia Left axis deviation Low voltage QRS Cannot rule out Anterior infarct , age undetermined Abnormal ECG No previous  ECGs available  RADIOLOGY  I personally viewed and evaluated these images as part of my medical decision making, as well as reviewing the written report by the radiologist.  ED Provider Interpretation: No evidence of nephrolithiasis or nephrosis.  CT ABDOMEN PELVIS WO CONTRAST Result Date: 11/15/2023 CLINICAL DATA:  Bilateral flank pain, difficulty urinating and chills. EXAM: CT ABDOMEN AND PELVIS WITHOUT CONTRAST TECHNIQUE: Multidetector CT imaging of the abdomen and pelvis was performed following the standard protocol without IV contrast. RADIATION DOSE REDUCTION: This exam was performed according to the departmental dose-optimization program which includes automated exposure control, adjustment of the mA and/or kV according to patient size and/or use of iterative reconstruction technique. COMPARISON:  CTA pelvis 11/08/2023 FINDINGS: Lower chest: No acute abnormality. Hepatobiliary: The liver demonstrates evidence of steatosis. The gallbladder is unremarkable. No evidence of hepatic mass or biliary ductal dilatation. Pancreas: Unremarkable. No pancreatic  ductal dilatation or surrounding inflammatory changes. Spleen: Normal in size without focal abnormality. Adrenals/Urinary Tract: No adrenal masses. Bilateral kidneys demonstrate no evidence of hydronephrosis, calculi or lesions. The bladder is unremarkable. Stomach/Bowel: Bowel shows no evidence of obstruction, ileus, inflammation or lesion. The appendix is normal. No free intraperitoneal air. Vascular/Lymphatic: No significant vascular findings are present. No enlarged abdominal or pelvic lymph nodes. Reproductive: Stable moderate prostatic enlargement. Other: Small left inguinal hernia containing fat. Musculoskeletal: Stable meningioma of the L1 vertebral body. IMPRESSION: 1. No acute findings in the abdomen or pelvis. 2. Hepatic steatosis. 3. Stable moderate prostatic enlargement. 4. Small left inguinal hernia containing fat. Electronically Signed   By: Marcey Moan M.D.   On: 11/15/2023 15:52    PROCEDURES:  Critical Care performed: No  Procedures   MEDICATIONS ORDERED IN ED: Medications  lactated ringers  infusion (has no administration in time range)  lactated ringers  bolus 1,000 mL (has no administration in time range)    And  lactated ringers  bolus 1,000 mL (has no administration in time range)  ketorolac  (TORADOL ) 30 MG/ML injection 30 mg (30 mg Intravenous Given 11/15/23 1636)  ondansetron  (ZOFRAN ) injection 4 mg (4 mg Intravenous Given 11/15/23 1636)  cefTRIAXone (ROCEPHIN) 2 g in sodium chloride  0.9 % 100 mL IVPB (0 g Intravenous Stopped 11/15/23 1834)  sodium chloride  0.9 % bolus 1,000 mL (0 mLs Intravenous Stopped 11/15/23 1912)     IMPRESSION / MDM / ASSESSMENT AND PLAN / ED COURSE  I reviewed the triage vital signs and the nursing notes.                              Differential diagnosis includes, but is not limited to, acute appendicitis, renal colic, testicular torsion, urinary tract infection/pyelonephritis, prostatitis,  epididymitis, diverticulitis, small bowel  obstruction or ileus, colitis, abdominal aortic aneurysm, gastroenteritis, hernia, etc.   Patient's presentation is most consistent with acute complicated illness / injury requiring diagnostic workup.  Patient's diagnosis is consistent with acute UTI likely due to prostatitis versus BPH.  Patient also presenting afebrile but tachycardic found to have a left shift with elevated WBCs of 15.9.  After initial IV course of ceftriaxone, patient began to exhibit some hypotension.  He remained stable without complaints of acute chest pain, abdominal pain, headache, or weakness.  Fluid bolus was administered, and sepsis protocol was initiated with additional fluid bolus orders secondary to hypertension.  Patient is agreeable to the plan for admission at this time.  Dr. Dorinda has been contacted and will admit the patient for management of  his acute sepsis secondary to UTI.     Clinical Course as of 11/15/23 2102  Wed Nov 15, 2023  2033 WBC(!): 15.9 Patient presented to the ED endorsing bilateral flank pain as well as some urinary urgency and some outflow obstruction.  Found to have elevated WBCs with a left shift. [JM]  2034 Lactic acid, plasma Patient with a normal lactic at this time.  He is afebrile and endorsing decreased pain.  Has been afebrile over the course in the ED. [JM]  2034 CT ABDOMEN PELVIS WO CONTRAST CT abdomen pelvis without evidence of acute nephrolithiasis or hydronephrosis.  Symptoms likely represent an acute UTI/prostatitis.  Started empirically on ceftriaxone. [JM]  2034 Patient with downtrending blood pressures hypotension along with tachycardia, and leukocytosis consistent with acute sepsis.  Sepsis protocol initiated in full.  Patient agreeable to admission at this time. [JM]    Clinical Course User Index [JM] Brylin Stanislawski, Candida LULLA Kings, PA-C    FINAL CLINICAL IMPRESSION(S) / ED DIAGNOSES   Final diagnoses:  Acute prostatitis  Lower urinary tract infectious disease  Sepsis, due  to unspecified organism, unspecified whether acute organ dysfunction present [facility])     Rx / DC Orders   ED Discharge Orders     None        Note:  This document was prepared using Dragon voice recognition software and may include unintentional dictation errors.    Loyd Candida LULLA Kings, PA-C 11/15/23 2333    Waymond Lorelle Cummins, MD 11/17/23 [phone]

## 2023-11-15 NOTE — ED Triage Notes (Signed)
 Patient c/o bilateral flank pain and penis pain. Reports difficulty urinating. Reports chills.  States history of kidney stones

## 2023-11-15 NOTE — ED Notes (Addendum)
 Gave report to Aram, Charity fundraiser

## 2023-11-15 NOTE — H&P (Signed)
 History and Physical    Patient: Keith Osborne FMW:[id number] DOB: 1970-09-08 DOA: 11/15/2023 DOS: the patient was seen and examined on 11/15/2023 PCP: Stanton Lynwood FALCON, MD  Patient coming from: Home  Chief Complaint: Bilateral flank pain Chief Complaint  Patient presents with   Flank Pain   HPI: Keith Osborne is a 53 y.o. male with medical history significant of hypertension, sleep apnea on CPAP, diabetes type 2, GERD, BPH, migraine headache who was otherwise well until this afternoon when he started experiencing bilateral flank pain, pain with intensity 8/10 relieved by pain medication administered in the emergency room.  Patient started having associated frequency, dysuria and therefore came to the emergency room for further management.  He denied chest pain, cough, nausea or vomiting.  ED course Upon arrival to the emergency room patient had temperature 98.2, respiratory rate 18, pulse as high as 120, blood pressure 80/65 saturating 99% on room air. Patient received sepsis bolus of fluid, IV ceftriaxone, urinalysis suggestive of UTI, CT scan of the abdomen without acute pathology.  Review of Systems: As mentioned in the history of present illness. All other systems reviewed and are negative. Past Medical History:  Diagnosis Date   Acid reflux    Arthritis    Diabetes mellitus without complication (HCC)    Hx of dysplastic nevus 05/25/2016   R top of shoulder   Hypertension    Migraines    Neuromuscular disorder (HCC)    Psoriasis    Sleep apnea    uses CPAP   Past Surgical History:  Procedure Laterality Date   COLONOSCOPY WITH PROPOFOL  N/A 03/18/2022   Procedure: COLONOSCOPY WITH PROPOFOL ;  Surgeon: Jinny Carmine, MD;  Location: [facility];  Service: Endoscopy;  Laterality: N/A;   FOOT SURGERY Left    IR RADIOLOGIST EVAL & MGMT  10/30/2023   NERVE REPAIR Right 03/07/2018   Procedure: Baxter's Release;  Surgeon: Ashley Soulier, DPM;  Location: [facility];  Service: Podiatry;  Laterality: Right;   PLANTAR FASCIA RELEASE Right 03/07/2018   Procedure: Endoscopic Plantar Fasc. Release Right;  Surgeon: Ashley Soulier, DPM;  Location: [facility];  Service: Podiatry;  Laterality: Right;  general with local   POLYPECTOMY  03/18/2022   Procedure: POLYPECTOMY;  Surgeon: Jinny Carmine, MD;  Location: [facility];  Service: Endoscopy;;   TONSILLECTOMY     53 y.o.   WISDOM TOOTH EXTRACTION     Social History:  reports that he has never smoked. He has never been exposed to tobacco smoke. He has never used smokeless tobacco. He reports current alcohol use. He reports that he does not use drugs.  Allergies  Allergen Reactions   Cyclobenzaprine Shortness Of Breath    Family History  Problem Relation Age of Onset   Breast cancer Maternal Aunt     Prior to Admission medications   Medication Sig Start Date End Date Taking? Authorizing Provider  aspirin 81 MG chewable tablet Chew by mouth daily.    [provider]  aspirin-acetaminophen -caffeine (MIGRAINE FORMULA) 250-250-65 MG per tablet As directed by package.    [provider]  cetirizine-pseudoephedrine (ZYRTEC-D) 5-120 MG per tablet Take by mouth.    [provider]  Dulaglutide (TRULICITY) 0.75 MG/0.5ML SOPN Inject 0.75 mg into the skin 7 days.    [provider]  esomeprazole (NEXIUM) 40 MG capsule Take 40 mg by mouth daily at 12 noon.    [provider]  FIBER PO Take 1 tablet by mouth  daily.    [provider]  ibuprofen  (MOTRIN  IB) 200 MG tablet Take 3 tablets (600 mg total) by mouth every 6 (six) hours as needed. 01/21/16   Harlee Lynwood LABOR, MD  lisinopril-hydrochlorothiazide (PRINZIDE,ZESTORETIC) 20-25 MG tablet Take 1 tablet by mouth daily.    [provider]  MEGARED OMEGA-3 KRILL OIL 500 MG CAPS Take by mouth. Patient not taking: Reported on 03/09/2022    [provider]  meloxicam  (MOBIC ) 15 MG tablet  Take 1 tablet (15 mg total) by mouth daily. 08/16/23   McDonald, Juliene SAUNDERS, DPM  ondansetron  (ZOFRAN ) 4 MG tablet Take 1 tablet (4 mg total) by mouth every 8 (eight) hours as needed for nausea or vomiting. Patient not taking: Reported on 02/23/2018 01/21/16   Harlee Lynwood LABOR, MD  oxyCODONE -acetaminophen  (PERCOCET) 5-325 MG tablet Take 1-2 tablets by mouth every 6 (six) hours as needed for severe pain. 03/07/18   Ashley Soulier, DPM  oxyCODONE -acetaminophen  (ROXICET) 5-325 MG tablet Take 1 tablet by mouth every 6 (six) hours as needed. Patient not taking: Reported on 02/23/2018 01/21/16   Harlee Lynwood LABOR, MD  propranolol (INDERAL) 40 MG tablet Take 40 mg by mouth 2 (two) times daily.  05/01/13   [provider]  tamsulosin  (FLOMAX ) 0.4 MG CAPS capsule Take 1 capsule (0.4 mg total) by mouth daily. 01/21/16   Harlee Lynwood LABOR, MD    Physical Exam: Vitals:   11/15/23 1421 11/15/23 1751 11/15/23 1830 11/15/23 1911  BP:  (!) 73/62 (!) 80/61 (!) 87/59  Pulse:  (!) 122 (!) 115 (!) 117  Resp:  18 18 18   Temp:  98.3 F (36.8 C)    TempSrc:  Oral    SpO2:  98% 94% 98%  Weight: 109.8 kg     Height: 5' 5 (1.651 m)      Constitutional:      General: He is not in acute distress. Eyes:     Conjunctiva/sclera: Conjunctivae normal.     Pupils: Pupils are equal, round, and reactive to light.  Neck:     Vascular: No carotid bruit.  Cardiovascular: Tachycardia    Rate and Rhythm:     Heart sounds: No murmur heard. Pulmonary:     Effort: Pulmonary effort is normal. No respiratory distress.  Abdominal:   General: Abdomen is flat. Bowel sounds are normal.  Musculoskeletal:        General: No swelling or tenderness. Normal range of motion.  Lymphadenopathy: Cervical: No cervical adenopathy.  Neurological:     General: No focal deficit present.     Mental Status: He is oriented to person, place, and time.     Cranial Nerves: No cranial nerve deficit.  Psychiatric:        Mood and Affect: Mood  normal.     Data Reviewed: CT scan of the abdomen did not show any acute pathology ED physician note reviewed, previous chart reviewed with and case discussed with ED physician    Latest Ref Rng & Units 11/15/2023    2:23 PM 01/21/2016   11:38 AM 03/20/2013    2:02 PM  BMP  Glucose 70 - 99 mg/dL 881  892  94   BUN 6 - 20 mg/dL 21  21  21    Creatinine 0.61 - 1.24 mg/dL 9.10  8.96  8.99   Sodium 135 - 145 mmol/L 135  139  138   Potassium 3.5 - 5.1 mmol/L 3.7  3.9  3.8   Chloride 98 - 111  mmol/L 98  106  107   CO2 22 - 32 mmol/L 24  25  30    Calcium 8.9 - 10.3 mg/dL 9.3  9.4  9.2        Latest Ref Rng & Units 11/15/2023    2:23 PM 01/21/2016   11:38 AM 03/20/2013    2:02 PM  CBC  WBC 4.0 - 10.5 K/uL 15.9  7.8  9.2   Hemoglobin 13.0 - 17.0 g/dL 85.1  83.7  84.0   Hematocrit 39.0 - 52.0 % 44.0  47.1  47.3   Platelets 150 - 400 K/uL 262  237  270      Assessment and Plan:  Sepsis secondary to urinary tract infection Patient presented with WBC 15.9, tachycardia 117, BP 87/59 in the setting of UTI Urinalysis suggestive of UTI Patient presented with dysuria, frequency and urge Continue ceftriaxone Follow-up on culture results Fluid bolus given in the emergency room Monitor closely  Type 2 diabetes without complication Last A1c September 2025 was 6.0 Will keep on sliding scale for now Monitor glucose closely  Essential hypertension Holding blood pressure meds at this time on account of  hypotension  Obstructive sleep apnea on CPAP Continue CPAP at night   GERD Continue PPI therapy  BPH Continue finasteride  DVT prophylaxis-continue Lovenox   Advance Care Planning:   Code Status: Prior full code  Consults: None  Family Communication: Discussed with wife at bedside  Severity of Illness: The appropriate patient status for this patient is INPATIENT. Inpatient status is judged to be reasonable and necessary in order to provide the required intensity of service to ensure  the patient's safety. The patient's presenting symptoms, physical exam findings, and initial radiographic and laboratory data in the context of their chronic comorbidities is felt to place them at high risk for further clinical deterioration. Furthermore, it is not anticipated that the patient will be medically stable for discharge from the hospital within 2 midnights of admission.   * I certify that at the point of admission it is my clinical judgment that the patient will require inpatient hospital care spanning beyond 2 midnights from the point of admission due to high intensity of service, high risk for further deterioration and high frequency of surveillance required.*  Author: Drue ONEIDA Potter, MD 11/15/2023 8:58 PM  For on call review [email or web address].

## 2023-11-15 NOTE — Progress Notes (Signed)
 CODE SEPSIS - PHARMACY COMMUNICATION  **Broad Spectrum Antibiotics should be administered within 1 hour of Sepsis diagnosis**  Time Code Sepsis Called/Page Received: 2032  Antibiotics Ordered: ceftriaxone  Time of 1st antibiotic administration: 1743  Additional action taken by pharmacy:    If necessary, Name of Provider/Nurse Contacted:      Suzann Allean LABOR ,PharmD Clinical Pharmacist  11/15/2023  8:50 PM

## 2023-11-15 NOTE — Sepsis Progress Note (Signed)
 Elink monitoring for the code sepsis protocol.

## 2023-11-15 NOTE — ED Notes (Signed)
Provider made aware of VS.

## 2023-11-16 DIAGNOSIS — A419 Sepsis, unspecified organism: Secondary | ICD-10-CM | POA: Diagnosis not present

## 2023-11-16 DIAGNOSIS — N390 Urinary tract infection, site not specified: Secondary | ICD-10-CM | POA: Diagnosis not present

## 2023-11-16 LAB — BASIC METABOLIC PANEL WITH GFR
Anion gap: 8 (ref 5–15)
BUN: 27 mg/dL — ABNORMAL HIGH (ref 6–20)
CO2: 24 mmol/L (ref 22–32)
Calcium: 8 mg/dL — ABNORMAL LOW (ref 8.9–10.3)
Chloride: 102 mmol/L (ref 98–111)
Creatinine, Ser: 1.05 mg/dL (ref 0.61–1.24)
GFR, Estimated: >60 mL/min (ref >60)
Glucose, Bld: 115 mg/dL — ABNORMAL HIGH (ref 70–99)
Potassium: 3.2 mmol/L — ABNORMAL LOW (ref 3.5–5.1)
Sodium: 134 mmol/L — ABNORMAL LOW (ref 135–145)

## 2023-11-16 LAB — GLUCOSE, CAPILLARY
Glucose-Capillary: 106 mg/dL — ABNORMAL HIGH (ref 70–99)
Glucose-Capillary: 103 mg/dL — ABNORMAL HIGH (ref 70–99)
Glucose-Capillary: 118 mg/dL — ABNORMAL HIGH (ref 70–99)
Glucose-Capillary: 132 mg/dL — ABNORMAL HIGH (ref 70–99)

## 2023-11-16 LAB — HIV ANTIBODY (ROUTINE TESTING W REFLEX): HIV Screen 4th Generation wRfx: NONREACTIVE

## 2023-11-16 LAB — CBC
HCT: 33.8 % — ABNORMAL LOW (ref 39.0–52.0)
Hemoglobin: 11.6 g/dL — ABNORMAL LOW (ref 13.0–17.0)
MCH: 29.8 pg (ref 26.0–34.0)
MCHC: 34.3 g/dL (ref 30.0–36.0)
MCV: 86.9 fL (ref 80.0–100.0)
Platelets: 196 10*3/uL (ref 150–400)
RBC: 3.89 MIL/uL — ABNORMAL LOW (ref 4.22–5.81)
RDW: 12.5 % (ref 11.5–15.5)
WBC: 20.3 10*3/uL — ABNORMAL HIGH (ref 4.0–10.5)
nRBC: 0 % (ref 0.0–0.2)

## 2023-11-16 LAB — HEMOGLOBIN A1C
Hgb A1c MFr Bld: 5.5 % (ref 4.8–5.6)
Mean Plasma Glucose: 111.15 mg/dL

## 2023-11-16 LAB — MRSA NEXT GEN BY PCR, NASAL: MRSA by PCR Next Gen: NOT DETECTED

## 2023-11-16 MED ORDER — ORAL CARE MOUTH RINSE: 15.0000 mL | OROMUCOSAL | Status: DC | PRN

## 2023-11-16 MED ORDER — POTASSIUM CHLORIDE CRYS ER 20 MEQ PO TBCR
40.0000 meq | EXTENDED_RELEASE_TABLET | Freq: Once | ORAL | Status: AC
  Administered 2023-11-16: 40 meq via ORAL
  Filled 2023-11-16: qty 2

## 2023-11-16 MED ORDER — INFLUENZA VIRUS VACC SPLIT PF (FLUZONE) 0.5 ML IM SUSY
0.5000 mL | PREFILLED_SYRINGE | INTRAMUSCULAR | Status: DC
Start: 2023-11-17 — End: 2023-11-17

## 2023-11-16 MED ORDER — PNEUMOCOCCAL 20-VAL CONJ VACC 0.5 ML IM SUSY
0.5000 mL | PREFILLED_SYRINGE | INTRAMUSCULAR | Status: AC
  Administered 2023-11-17: 0.5 mL via INTRAMUSCULAR
  Filled 2023-11-16: qty 0.5

## 2023-11-16 MED ORDER — SENNOSIDES-DOCUSATE SODIUM 8.6-50 MG PO TABS: 2.0000 | ORAL_TABLET | Freq: Two times a day (BID) | ORAL | Status: DC | PRN

## 2023-11-16 MED ORDER — BISACODYL 10 MG RE SUPP
10.0000 mg | Freq: Once | RECTAL | Status: AC
  Administered 2023-11-16: 10 mg via RECTAL
  Filled 2023-11-16: qty 1

## 2023-11-16 MED ORDER — CHLORHEXIDINE GLUCONATE CLOTH 2 % EX PADS
6.0000 | MEDICATED_PAD | Freq: Every day | CUTANEOUS | Status: DC
  Administered 2023-11-16: 6 via TOPICAL

## 2023-11-16 MED ORDER — ACETAMINOPHEN 325 MG PO TABS
650.0000 mg | ORAL_TABLET | Freq: Four times a day (QID) | ORAL | Status: DC | PRN
  Administered 2023-11-16 – 2023-11-17 (×3): 650 mg via ORAL
  Filled 2023-11-16 (×3): qty 2

## 2023-11-16 MED ORDER — ONDANSETRON HCL 4 MG/2ML IJ SOLN: 4.0000 mg | Freq: Four times a day (QID) | INTRAMUSCULAR | Status: DC | PRN

## 2023-11-16 MED ORDER — POLYETHYLENE GLYCOL 3350 17 G PO PACK
17.0000 g | PACK | Freq: Every day | ORAL | Status: DC
  Administered 2023-11-16 – 2023-11-17 (×2): 17 g via ORAL
  Filled 2023-11-16 (×2): qty 1

## 2023-11-16 NOTE — Progress Notes (Signed)
 Pt alert and oriented. On room air. Tolerating diets. Up with one assist to bedside commode. Report given to [place], Charity fundraiser.

## 2023-11-16 NOTE — Progress Notes (Signed)
 Patient admitted to ICU-1 in stable condition. Patient ambulated from the stretcher to bed without incident. Patient is alert & oriented x 4. Lungs clear on room air. Resp even & unlabored. CPAP HS. CHG Bath completed. Skin assessment complete with Lesley, RN.  Patient reports pain 4/10 to head. Received PRN Tylenol  as ordered. NS @ 125 mls/hr infusing. Bed is in lowest position. Belongings & call bell within reach. Bed in lowest position.

## 2023-11-16 NOTE — Hospital Course (Addendum)
 Hospital course / significant events:   HPI: Keith Osborne is a 53 y.o. male with medical history significant of hypertension, sleep apnea on CPAP, diabetes type 2, GERD, BPH, migraine headache who was otherwise well until this afternoon when he started experiencing bilateral flank pain, pain with intensity 8/10 relieved by pain medication administered in the emergency room.  Patient started having associated frequency, dysuria and therefore came to the emergency room for further management.  He denied chest pain, cough, nausea or vomiting.   10/01: to ED. Admitted w/ sepsis d/t UTI. Concern w/ hypotension and tachycardia  10/02: BP stabilized, awaiting cultures. Having some dysuria and gross hematuria following in/out cath, monitoring 10/03: (+)Ecoli, s/s Sensitive to cephalosporins, sent home on keflex. ENcourage f/u urology and PCP     Consultants:  none  Procedures/Surgeries: none      ASSESSMENT & PLAN:   Sepsis secondary to urinary tract infection Severe sepsis given hypotension Ceftriaxone --> Keflex to complete 7 days total  He is on immune suppressant Cosentyx for hidradenitis, may need to consider DC this medication w/ concern for severe infection  More likely BPH is predisposing to UTI risk, he has f/u in place w/ urology and is encouraged to let them know he was in the hospital and ask if they can see him next couple weeks  Urinary retention Requiring In/out cath after which gross hematuria noted but hematuria has since resolved  Likely hematuria is from catheter trauma, possibly from UTI, have r/o stones If persistent/worsening especially if dropping Hgb or BP will work up further for now watchful waiting  likely BPH is predisposing to UTI risk, he has f/u in place w/ urology and is encouraged to let them know he was in the hospital and ask if they can see him next couple weeks   Type 2 diabetes without complication Last A1c September 2025 was 6.0 Resume home meds     Essential hypertension Low BP here d/t sepsis, this is improving  Continue hold home BP meds lisinopril  Restart propranolol lower dose  Obstructive sleep apnea on CPAP Continue CPAP at night    GERD Continue PPI therapy   BPH Continue finasteride, tamsulosin  Follow outpatient as above   Migraine Restarting propranolol at lower dose   Hidradenitis suppurativa Derm follow up  He is on immune suppressant Cosentyx for hidradenitis, may need to consider DC this medication w/ concern for severe infection   Class 3 obesity based on BMI: Body mass index is 41.2 kg/m.SABRA Significantly low or high BMI is associated with higher medical risk.  Underweight - under 18  overweight - 25 to 29 obese - 30 or more Class 1 obesity: BMI of 30.0 to 34 Class 2 obesity: BMI of 35.0 to 39 Class 3 obesity: BMI of 40.0 to 49 Super Morbid Obesity: BMI 50-59 Super-super Morbid Obesity: BMI 60+ Healthy nutrition and physical activity advised as adjunct to other disease management and risk reduction treatments    DVT prophylaxis: lovenox IV fluids: havd dc continuous IV fluids  Nutrition: carb modified Central lines / other devices: none  Code Status: FULL CODE ACP documentation reviewed:  none on file in [facility]  TOC needs: none Medical barriers to dispo: awaiting cultures. Expected medical readiness for discharge hopefully tomorrow if cultures result and BP remains stable .

## 2023-11-16 NOTE — Plan of Care (Signed)

## 2023-11-16 NOTE — Progress Notes (Addendum)
 PROGRESS NOTE    Keith Osborne   FMW:[id number] DOB: March 06, 1970  DOA: 11/15/2023 Date of Service: 11/16/23 which is hospital day 1  PCP: Stanton Lynwood FALCON, MD    Hospital course / significant events:   HPI: Keith Osborne is a 53 y.o. male with medical history significant of hypertension, sleep apnea on CPAP, diabetes type 2, GERD, BPH, migraine headache who was otherwise well until this afternoon when he started experiencing bilateral flank pain, pain with intensity 8/10 relieved by pain medication administered in the emergency room.  Patient started having associated frequency, dysuria and therefore came to the emergency room for further management.  He denied chest pain, cough, nausea or vomiting.   10/01: to ED. Admitted w/ sepsis d/t UTI. Concern w/ hypotension and tachycardia  10/02: BP stabilized, awaiting cultures     Consultants:  none  Procedures/Surgeries: none      ASSESSMENT & PLAN:   Sepsis secondary to urinary tract infection Severe sepsis given hypotension Continue ceftriaxone pending cultures Can d/c continuous fluids Continue hold home BP meds lisinopril and propranolol, restart as able  He is on immune suppressant Cosentyx for hidradenitis, may need to consider DC this medication w/ concern for severe infection   Urinary retention Requiring In/out cath after which gross hematuria noted Likely hematuria is from catheter trauma, possibly from UTI, have r/o stones If persistent/worsening especially if dropping Hgb or BP will work up further for now watchful waiting    Type 2 diabetes without complication Last A1c September 2025 was 6.0 Can dc insulin    Essential hypertension Holding blood pressure meds at this time on account of  hypotension   Obstructive sleep apnea on CPAP Continue CPAP at night    GERD Continue PPI therapy   BPH Continue finasteride In/out cath as needed  Consider Foley and urology consult/followup if retention    Migraine Holding propranolol ppx  Hidradenitis suppurativa Derm follow up  He is on immune suppressant Cosentyx for hidradenitis, may need to consider DC this medication w/ concern for severe infection   Class 3 obesity based on BMI: Body mass index is 41.2 kg/m.SABRA Significantly low or high BMI is associated with higher medical risk.  Underweight - under 18  overweight - 25 to 29 obese - 30 or more Class 1 obesity: BMI of 30.0 to 34 Class 2 obesity: BMI of 35.0 to 39 Class 3 obesity: BMI of 40.0 to 49 Super Morbid Obesity: BMI 50-59 Super-super Morbid Obesity: BMI 60+ Healthy nutrition and physical activity advised as adjunct to other disease management and risk reduction treatments    DVT prophylaxis: lovenox IV fluids: havd dc continuous IV fluids  Nutrition: carb modified Central lines / other devices: none  Code Status: FULL CODE ACP documentation reviewed:  none on file in [facility]  TOC needs: none Medical barriers to dispo: awaiting cultures. Expected medical readiness for discharge hopefully tomorrow if cultures result and BP remains stable .              Subjective / Brief ROS:  Patient reports feeling better this morning Denies CP/SOB.  Pain controlled.  Denies new weakness.  Tolerating diet.   Family Communication: none at this time    Objective Findings:  Vitals:   11/16/23 1100 11/16/23 1400 11/16/23 1600 11/16/23 1712  BP: (!) 104/91 112/61 108/66 103/73  Pulse: 96 92 88 96  Resp: (!) 23 (!) 22 19   Temp:    97.9 F (36.6 C)  TempSrc:  SpO2: 95% 99% 98% 99%  Weight:      Height:        Intake/Output Summary (Last 24 hours) at 11/16/2023 1827 Last data filed at 11/16/2023 1758 Gross per 24 hour  Intake 5111.25 ml  Output 2550 ml  Net 2561.25 ml   Filed Weights   11/15/23 1421 11/16/23 0002  Weight: 109.8 kg 112.3 kg    Examination:  Physical Exam Constitutional:      General: He is not in acute  distress. Cardiovascular:     Rate and Rhythm: Normal rate and regular rhythm.  Pulmonary:     Effort: Pulmonary effort is normal.     Breath sounds: Normal breath sounds.  Abdominal:     General: Abdomen is flat.     Palpations: Abdomen is soft.  Musculoskeletal:     Right lower leg: No edema.     Left lower leg: No edema.  Neurological:     Mental Status: He is alert.          Scheduled Medications:   aspirin EC  81 mg Oral Daily   Chlorhexidine Gluconate Cloth  6 each Topical Daily   enoxaparin (LOVENOX) injection  55 mg Subcutaneous Q24H   finasteride  5 mg Oral Daily   [START ON 11/17/2023] influenza vac split trivalent PF  0.5 mL Intramuscular Tomorrow-1000   pantoprazole  40 mg Oral Daily   [START ON 11/17/2023] pneumococcal 20-valent conjugate vaccine  0.5 mL Intramuscular Tomorrow-1000   polyethylene glycol  17 g Oral Daily    Continuous Infusions:  cefTRIAXone (ROCEPHIN)  IV Stopped (11/16/23 1541)    PRN Medications:  acetaminophen , ondansetron  (ZOFRAN ) IV, mouth rinse, senna-docusate  Antimicrobials from admission:  Anti-infectives (From admission, onward)    Start     Dose/Rate Route Frequency Ordered Stop   11/16/23 1600  cefTRIAXone (ROCEPHIN) 1 g in sodium chloride  0.9 % 100 mL IVPB        1 g 200 mL/hr over 30 Minutes Intravenous Every 24 hours 11/15/23 2104     11/15/23 1700  cefTRIAXone (ROCEPHIN) 2 g in sodium chloride  0.9 % 100 mL IVPB        2 g 200 mL/hr over 30 Minutes Intravenous  Once 11/15/23 1657 11/15/23 1834           Data Reviewed:  I have personally reviewed the following...  CBC: Recent Labs  Lab 11/15/23 1423 11/16/23 0425  WBC 15.9* 20.3*  NEUTROABS 14.3*  --   HGB 14.8 11.6*  HCT 44.0 33.8*  MCV 87.6 86.9  PLT 262 196   Basic Metabolic Panel: Recent Labs  Lab 11/15/23 1423 11/16/23 0425  NA 135 134*  K 3.7 3.2*  CL 98 102  CO2 24 24  GLUCOSE 118* 115*  BUN 21* 27*  CREATININE 0.89 1.05  CALCIUM 9.3  8.0*   GFR: Estimated Creatinine Clearance: 95.2 mL/min (by C-G formula based on SCr of 1.05 mg/dL). Liver Function Tests: Recent Labs  Lab 11/15/23 1423  AST 36  ALT 51*  ALKPHOS 68  BILITOT 0.8  PROT 7.9  ALBUMIN 4.3   No results for input(s): LIPASE, AMYLASE in the last 168 hours. No results for input(s): AMMONIA in the last 168 hours. Coagulation Profile: No results for input(s): INR, PROTIME in the last 168 hours. Cardiac Enzymes: No results for input(s): CKTOTAL, CKMB, CKMBINDEX, TROPONINI in the last 168 hours. BNP (last 3 results) No results for input(s): PROBNP in the last 8760 hours. HbA1C: Recent  Labs    11/15/23 1423  HGBA1C 5.5   CBG: Recent Labs  Lab 11/15/23 2357 11/16/23 0732 11/16/23 1109  GLUCAP 106* 103* 118*   Lipid Profile: No results for input(s): CHOL, HDL, LDLCALC, TRIG, CHOLHDL, LDLDIRECT in the last 72 hours. Thyroid Function Tests: No results for input(s): TSH, T4TOTAL, FREET4, T3FREE, THYROIDAB in the last 72 hours. Anemia Panel: No results for input(s): VITAMINB12, FOLATE, FERRITIN, TIBC, IRON, RETICCTPCT in the last 72 hours. Most Recent Urinalysis On File:     Component Value Date/Time   COLORURINE YELLOW (A) 11/15/2023 1423   APPEARANCEUR CLOUDY (A) 11/15/2023 1423   APPEARANCEUR Clear 03/20/2013 1356   LABSPEC 1.021 11/15/2023 1423   LABSPEC 1.025 03/20/2013 1356   PHURINE 5.0 11/15/2023 1423   GLUCOSEU NEGATIVE 11/15/2023 1423   GLUCOSEU Negative 03/20/2013 1356   HGBUR SMALL (A) 11/15/2023 1423   BILIRUBINUR NEGATIVE 11/15/2023 1423   BILIRUBINUR Negative 03/20/2013 1356   KETONESUR NEGATIVE 11/15/2023 1423   PROTEINUR 100 (A) 11/15/2023 1423   NITRITE POSITIVE (A) 11/15/2023 1423   LEUKOCYTESUR LARGE (A) 11/15/2023 1423   LEUKOCYTESUR Trace 03/20/2013 1356   Sepsis Labs: @LABRCNTIP (procalcitonin:4,lacticidven:4) Microbiology: Recent Results (from the past 240  hours)  Urine Culture     Status: Abnormal (Preliminary result)   Collection Time: 11/15/23  2:23 PM   Specimen: Urine, Random  Result Value Ref Range Status   Specimen Description   Final    URINE, RANDOM Performed at [facility], [address], [place], [place] [address]    Special Requests   Final    Normal Performed at [facility], [address] [place], [place] [address]    Culture (A)  Final    >=100,000 COLONIES/mL GRAM NEGATIVE RODS IDENTIFICATION TO FOLLOW Performed at [facility], [address] [place], [place] [address]    Report Status PENDING  Incomplete  Blood culture (single)     Status: None (Preliminary result)   Collection Time: 11/15/23  5:43 PM   Specimen: BLOOD  Result Value Ref Range Status   Specimen Description BLOOD BLOOD RIGHT ARM  Final   Special Requests   Final    BOTTLES DRAWN AEROBIC AND ANAEROBIC Blood Culture results may not be optimal due to an inadequate volume of blood received in culture bottles   Culture   Final    NO GROWTH < 24 HOURS Performed at [facility], [address] [place], [place] [address]    Report Status PENDING  Incomplete  MRSA Next Gen by PCR, Nasal     Status: None   Collection Time: 11/16/23 12:46 AM   Specimen: Nasal Mucosa; Nasal Swab  Result Value Ref Range Status   MRSA by PCR Next Gen NOT DETECTED NOT DETECTED Final    Comment: (NOTE) The GeneXpert MRSA Assay (FDA approved for NASAL specimens only), is one component of a comprehensive MRSA colonization surveillance program. It is not intended to diagnose MRSA infection nor to guide or monitor treatment for MRSA infections. Test performance is not FDA approved in patients less than 13 years old. Performed at [facility], [address] [place], [place] [address]       Radiology Studies last 3 days: CT ABDOMEN PELVIS WO CONTRAST Result Date: 11/15/2023 CLINICAL DATA:  Bilateral flank pain, difficulty  urinating and chills. EXAM: CT ABDOMEN AND PELVIS WITHOUT CONTRAST TECHNIQUE: Multidetector CT imaging of the abdomen and pelvis was performed following the standard protocol without IV contrast. RADIATION  DOSE REDUCTION: This exam was performed according to the departmental dose-optimization program which includes automated exposure control, adjustment of the mA and/or kV according to patient size and/or use of iterative reconstruction technique. COMPARISON:  CTA pelvis 11/08/2023 FINDINGS: Lower chest: No acute abnormality. Hepatobiliary: The liver demonstrates evidence of steatosis. The gallbladder is unremarkable. No evidence of hepatic mass or biliary ductal dilatation. Pancreas: Unremarkable. No pancreatic ductal dilatation or surrounding inflammatory changes. Spleen: Normal in size without focal abnormality. Adrenals/Urinary Tract: No adrenal masses. Bilateral kidneys demonstrate no evidence of hydronephrosis, calculi or lesions. The bladder is unremarkable. Stomach/Bowel: Bowel shows no evidence of obstruction, ileus, inflammation or lesion. The appendix is normal. No free intraperitoneal air. Vascular/Lymphatic: No significant vascular findings are present. No enlarged abdominal or pelvic lymph nodes. Reproductive: Stable moderate prostatic enlargement. Other: Small left inguinal hernia containing fat. Musculoskeletal: Stable meningioma of the L1 vertebral body. IMPRESSION: 1. No acute findings in the abdomen or pelvis. 2. Hepatic steatosis. 3. Stable moderate prostatic enlargement. 4. Small left inguinal hernia containing fat. Electronically Signed   By: Marcey Moan M.D.   On: 11/15/2023 15:52        Laneta Blunt, DO [facility] 11/16/2023, 6:27 PM    Dictation software may have been used to generate the above note. Typos may occur and escape review in typed/dictated notes. Please contact Dr Blunt directly for clarity if needed.  Staff may message me via secure chat in  Epic  but this may not receive an immediate response,  please page me for urgent matters!  If 7PM-7AM, please contact night coverage [email or web address]

## 2023-11-16 NOTE — Progress Notes (Signed)
 Dr. Marsa notified of pt having small blood clots and bloody urine as well as a temp of 99.3. No additional orders given at this time, will continue to monitor.

## 2023-11-17 ENCOUNTER — Other Ambulatory Visit: Payer: Self-pay

## 2023-11-17 DIAGNOSIS — A419 Sepsis, unspecified organism: Secondary | ICD-10-CM | POA: Diagnosis not present

## 2023-11-17 DIAGNOSIS — N390 Urinary tract infection, site not specified: Secondary | ICD-10-CM | POA: Diagnosis not present

## 2023-11-17 LAB — URINE CULTURE
Culture: >=100000 — AB
Special Requests: NORMAL

## 2023-11-17 LAB — CBC
HCT: 35.3 % — ABNORMAL LOW (ref 39.0–52.0)
Hemoglobin: 11.9 g/dL — ABNORMAL LOW (ref 13.0–17.0)
MCH: 29.8 pg (ref 26.0–34.0)
MCHC: 33.7 g/dL (ref 30.0–36.0)
MCV: 88.5 fL (ref 80.0–100.0)
Platelets: 213 K/uL (ref 150–400)
RBC: 3.99 MIL/uL — ABNORMAL LOW (ref 4.22–5.81)
RDW: 12.7 % (ref 11.5–15.5)
WBC: 13.3 K/uL — ABNORMAL HIGH (ref 4.0–10.5)
nRBC: 0 % (ref 0.0–0.2)

## 2023-11-17 LAB — MAGNESIUM: Magnesium: 1.8 mg/dL (ref 1.7–2.4)

## 2023-11-17 LAB — BASIC METABOLIC PANEL WITH GFR
Anion gap: 10 (ref 5–15)
BUN: 17 mg/dL (ref 6–20)
CO2: 23 mmol/L (ref 22–32)
Calcium: 8.5 mg/dL — ABNORMAL LOW (ref 8.9–10.3)
Chloride: 105 mmol/L (ref 98–111)
Creatinine, Ser: 0.78 mg/dL (ref 0.61–1.24)
GFR, Estimated: >60 mL/min (ref >60)
Glucose, Bld: 110 mg/dL — ABNORMAL HIGH (ref 70–99)
Potassium: 3.6 mmol/L (ref 3.5–5.1)
Sodium: 138 mmol/L (ref 135–145)

## 2023-11-17 MED ORDER — CEFDINIR 300 MG PO CAPS
300.0000 mg | ORAL_CAPSULE | Freq: Two times a day (BID) | ORAL | 0 refills | Status: DC
Start: 2023-11-17 — End: 2023-11-17
  Filled 2023-11-17: qty 14, 7d supply, fill #0

## 2023-11-17 MED ORDER — CEPHALEXIN 500 MG PO CAPS
500.0000 mg | ORAL_CAPSULE | Freq: Four times a day (QID) | ORAL | 0 refills | Status: AC
  Filled 2023-11-17: qty 28, 7d supply, fill #0

## 2023-11-17 MED ORDER — PROPRANOLOL HCL 40 MG PO TABS: 20.0000 mg | ORAL_TABLET | Freq: Two times a day (BID) | ORAL | Status: AC

## 2023-11-17 NOTE — Discharge Summary (Signed)
 Physician Discharge Summary   Patient: Keith Osborne MRN: [id number]  DOB: 27-Jan-1971   Admit:     Date of Admission: 11/15/2023 Admitted from: home   Discharge: Date of discharge: 11/17/23 Disposition: Home Condition at discharge: good  CODE STATUS: FULL CODE     Discharge Physician: Laneta Blunt, DO [facility]     PCP: Stanton Lynwood FALCON, MD  Recommendations for Outpatient Follow-up:  Follow up with PCP Stanton Lynwood FALCON, MD in 1-2 weeks --> recheck BP and decide on medications for this Follow up asap w/ urology   Discharge Instructions     Diet - low sodium heart healthy   Complete by: As directed    Increase activity slowly   Complete by: As directed          Discharge Diagnoses: Principal Problem:   Sepsis secondary to UTI [facility])       Hospital course / significant events:   HPI: Keith Osborne is a 53 y.o. male with medical history significant of hypertension, sleep apnea on CPAP, diabetes type 2, GERD, BPH, migraine headache who was otherwise well until this afternoon when he started experiencing bilateral flank pain, pain with intensity 8/10 relieved by pain medication administered in the emergency room.  Patient started having associated frequency, dysuria and therefore came to the emergency room for further management.  He denied chest pain, cough, nausea or vomiting.   10/01: to ED. Admitted w/ sepsis d/t UTI. Concern w/ hypotension and tachycardia  10/02: BP stabilized, awaiting cultures. Having some dysuria and gross hematuria following in/out cath, monitoring 10/03: (+)Ecoli, s/s Sensitive to cephalosporins, sent home on keflex. ENcourage f/u urology asap and PCP next 1-2 weeks      Consultants:  none  Procedures/Surgeries: none      ASSESSMENT & PLAN:   Sepsis secondary to urinary tract infection Severe sepsis given hypotension Ceftriaxone --> Keflex to complete 7 days total  He is on immune suppressant  Cosentyx for hidradenitis, may need to consider DC this medication w/ concern for severe infection  More likely BPH is predisposing to UTI risk, he has f/u in place w/ urology and is encouraged to let them know he was in the hospital and ask if they can see him next couple weeks  Urinary retention Requiring In/out cath after which gross hematuria noted but hematuria has since resolved  Likely hematuria is from catheter trauma, possibly from UTI, have r/o stones If persistent/worsening especially if dropping Hgb or BP will work up further for now watchful waiting  likely BPH is predisposing to UTI risk, he has f/u in place w/ urology and is encouraged to let them know he was in the hospital and ask if they can see him next couple weeks   Type 2 diabetes without complication Last A1c September 2025 was 6.0 Resume home meds    Essential hypertension Low BP here d/t sepsis, this is improving  Continue hold home BP meds lisinopril  Restart propranolol lower dose  Obstructive sleep apnea on CPAP Continue CPAP at night    GERD Continue PPI therapy   BPH Continue finasteride, tamsulosin  Follow outpatient as above   Migraine Restarting propranolol at lower dose   Hidradenitis suppurativa Derm follow up  He is on immune suppressant Cosentyx for hidradenitis, may need to consider DC this medication w/ concern for severe infection   Class 3 obesity based on BMI: Body mass index is 41.2 kg/m.SABRA Significantly low or high BMI is associated  with higher medical risk.  Underweight - under 18  overweight - 25 to 29 obese - 30 or more Class 1 obesity: BMI of 30.0 to 34 Class 2 obesity: BMI of 35.0 to 39 Class 3 obesity: BMI of 40.0 to 49 Super Morbid Obesity: BMI 50-59 Super-super Morbid Obesity: BMI 60+ Healthy nutrition and physical activity advised as adjunct to other disease management and risk reduction treatments            Discharge Instructions  Allergies as of 11/17/2023        Reactions   Cyclobenzaprine Shortness Of Breath        Medication List     PAUSE taking these medications    Cosentyx UnoReady 300 MG/2ML Soaj Wait to take this until your doctor or other care provider tells you to start again. Generic drug: Secukinumab Inject 300 mg into the skin every 30 (thirty) days.       TAKE these medications    aspirin EC 81 MG tablet Take by mouth daily.   cephALEXin 500 MG capsule Commonly known as: KEFLEX Take 1 capsule (500 mg total) by mouth 4 (four) times daily for 7 days.   cetirizine-pseudoephedrine 5-120 MG tablet Commonly known as: ZYRTEC-D Take by mouth.   esomeprazole 40 MG capsule Commonly known as: NEXIUM Take 40 mg by mouth daily at 12 noon.   FIBER PO Take 1 tablet by mouth daily.   finasteride 5 MG tablet Commonly known as: PROSCAR Take 5 mg by mouth daily.   MegaRed Omega-3 Krill Oil 500 MG Caps Take by mouth.   meloxicam  15 MG tablet Commonly known as: Mobic  Take 1 tablet (15 mg total) by mouth daily.   Migraine Formula 250-250-65 MG tablet Generic drug: aspirin-acetaminophen -caffeine As directed by package.   propranolol 40 MG tablet Commonly known as: INDERAL Take 0.5 tablets (20 mg total) by mouth 2 (two) times daily. PLEASE NOTE REDUCED DOSE DUE TO LOW BLOOD PRESSURE. INCREASE BACK TO 40 MG TWICE DAILY ONLY WHEN DIRECTED BY A DOCTOR/PA/NP What changed:  how much to take additional instructions   senna 8.6 MG tablet Commonly known as: SENOKOT Take 3 tablets by mouth daily.   tamsulosin  0.4 MG Caps capsule Commonly known as: Flomax  Take 1 capsule (0.4 mg total) by mouth daily.   Trulicity 3 MG/0.5ML Soaj Generic drug: Dulaglutide Inject 3 mg into the skin every 7 (seven) days.          Allergies  Allergen Reactions   Cyclobenzaprine Shortness Of Breath     Subjective: pt feeling well this morning except still some intermittent dysuria but this is improved when he is drinking more  water . No fever/chills, no dizziness/weakness, no CP/SOB   Discharge Exam: BP 110/71 (BP Location: Right Arm)   Pulse 83   Temp 97.6 F (36.4 C)   Resp 17   Ht 5' 5 (1.651 m)   Wt 112.3 kg   SpO2 97%   BMI 41.20 kg/m  General: Pt is alert, awake, not in acute distress Cardiovascular: RRR, S1/S2 +, no rubs, no gallops Respiratory: CTA bilaterally, no wheezing, no rhonchi Abdominal: Soft, NT, ND Extremities: no edema, no cyanosis     The results of significant diagnostics from this hospitalization (including imaging, microbiology, ancillary and laboratory) are listed below for reference.     Microbiology: Recent Results (from the past 240 hours)  Urine Culture     Status: Abnormal   Collection Time: 11/15/23  2:23 PM   Specimen: Urine,  Random  Result Value Ref Range Status   Specimen Description   Final    URINE, RANDOM Performed at [facility], [address] [place], [place] [address]    Special Requests   Final    Normal Performed at [facility], [address] [place], [place] [address]    Culture >=100,000 COLONIES/mL ESCHERICHIA COLI (A)  Final   Report Status 11/17/2023 FINAL  Final   Organism ID, Bacteria ESCHERICHIA COLI (A)  Final      Susceptibility   Escherichia coli - MIC*    AMPICILLIN >=32 RESISTANT Resistant     CEFAZOLIN  (URINE) Value in next row Sensitive      4 SENSITIVEThis is a modified FDA-approved test that has been validated and its performance characteristics determined by the reporting laboratory.  This laboratory is certified under the Clinical Laboratory Improvement Amendments CLIA as qualified to perform high complexity clinical laboratory testing.    CEFEPIME Value in next row Sensitive      4 SENSITIVEThis is a modified FDA-approved test that has been validated and its performance characteristics determined by the reporting laboratory.  This laboratory is certified under the Clinical Laboratory Improvement  Amendments CLIA as qualified to perform high complexity clinical laboratory testing.    ERTAPENEM Value in next row Sensitive      4 SENSITIVEThis is a modified FDA-approved test that has been validated and its performance characteristics determined by the reporting laboratory.  This laboratory is certified under the Clinical Laboratory Improvement Amendments CLIA as qualified to perform high complexity clinical laboratory testing.    CEFTRIAXONE Value in next row Sensitive      4 SENSITIVEThis is a modified FDA-approved test that has been validated and its performance characteristics determined by the reporting laboratory.  This laboratory is certified under the Clinical Laboratory Improvement Amendments CLIA as qualified to perform high complexity clinical laboratory testing.    CIPROFLOXACIN Value in next row Sensitive      4 SENSITIVEThis is a modified FDA-approved test that has been validated and its performance characteristics determined by the reporting laboratory.  This laboratory is certified under the Clinical Laboratory Improvement Amendments CLIA as qualified to perform high complexity clinical laboratory testing.    GENTAMICIN Value in next row Sensitive      4 SENSITIVEThis is a modified FDA-approved test that has been validated and its performance characteristics determined by the reporting laboratory.  This laboratory is certified under the Clinical Laboratory Improvement Amendments CLIA as qualified to perform high complexity clinical laboratory testing.    NITROFURANTOIN Value in next row Sensitive      4 SENSITIVEThis is a modified FDA-approved test that has been validated and its performance characteristics determined by the reporting laboratory.  This laboratory is certified under the Clinical Laboratory Improvement Amendments CLIA as qualified to perform high complexity clinical laboratory testing.    TRIMETH/SULFA Value in next row Sensitive      4 SENSITIVEThis is a modified  FDA-approved test that has been validated and its performance characteristics determined by the reporting laboratory.  This laboratory is certified under the Clinical Laboratory Improvement Amendments CLIA as qualified to perform high complexity clinical laboratory testing.    AMPICILLIN/SULBACTAM Value in next row Intermediate      4 SENSITIVEThis is a modified FDA-approved test that has been validated and its performance characteristics determined by the reporting laboratory.  This laboratory is certified under the Clinical Laboratory Improvement Amendments CLIA as qualified to perform high complexity clinical  laboratory testing.    PIP/TAZO Value in next row Sensitive      <=4 SENSITIVEThis is a modified FDA-approved test that has been validated and its performance characteristics determined by the reporting laboratory.  This laboratory is certified under the Clinical Laboratory Improvement Amendments CLIA as qualified to perform high complexity clinical laboratory testing.    MEROPENEM Value in next row Sensitive      <=4 SENSITIVEThis is a modified FDA-approved test that has been validated and its performance characteristics determined by the reporting laboratory.  This laboratory is certified under the Clinical Laboratory Improvement Amendments CLIA as qualified to perform high complexity clinical laboratory testing.    * >=100,000 COLONIES/mL ESCHERICHIA COLI  Blood culture (single)     Status: None (Preliminary result)   Collection Time: 11/15/23  5:43 PM   Specimen: BLOOD  Result Value Ref Range Status   Specimen Description BLOOD BLOOD RIGHT ARM  Final   Special Requests   Final    BOTTLES DRAWN AEROBIC AND ANAEROBIC Blood Culture results may not be optimal due to an inadequate volume of blood received in culture bottles   Culture   Final    NO GROWTH 2 DAYS Performed at [facility], [address], [place], [place] [address]    Report Status PENDING  Incomplete  MRSA Next  Gen by PCR, Nasal     Status: None   Collection Time: 11/16/23 12:46 AM   Specimen: Nasal Mucosa; Nasal Swab  Result Value Ref Range Status   MRSA by PCR Next Gen NOT DETECTED NOT DETECTED Final    Comment: (NOTE) The GeneXpert MRSA Assay (FDA approved for NASAL specimens only), is one component of a comprehensive MRSA colonization surveillance program. It is not intended to diagnose MRSA infection nor to guide or monitor treatment for MRSA infections. Test performance is not FDA approved in patients less than 47 years old. Performed at [facility], [address] [place], [place] [address]      Labs: BNP (last 3 results) No results for input(s): BNP in the last 8760 hours. Basic Metabolic Panel: Recent Labs  Lab 11/15/23 1423 11/16/23 0425 11/17/23 0348  NA 135 134* 138  K 3.7 3.2* 3.6  CL 98 102 105  CO2 24 24 23   GLUCOSE 118* 115* 110*  BUN 21* 27* 17  CREATININE 0.89 1.05 0.78  CALCIUM 9.3 8.0* 8.5*  MG  --   --  1.8   Liver Function Tests: Recent Labs  Lab 11/15/23 1423  AST 36  ALT 51*  ALKPHOS 68  BILITOT 0.8  PROT 7.9  ALBUMIN 4.3   No results for input(s): LIPASE, AMYLASE in the last 168 hours. No results for input(s): AMMONIA in the last 168 hours. CBC: Recent Labs  Lab 11/15/23 1423 11/16/23 0425 11/17/23 0348  WBC 15.9* 20.3* 13.3*  NEUTROABS 14.3*  --   --   HGB 14.8 11.6* 11.9*  HCT 44.0 33.8* 35.3*  MCV 87.6 86.9 88.5  PLT 262 196 213   Cardiac Enzymes: No results for input(s): CKTOTAL, CKMB, CKMBINDEX, TROPONINI in the last 168 hours. BNP: Invalid input(s): POCBNP CBG: Recent Labs  Lab 11/15/23 2357 11/16/23 0732 11/16/23 1109 11/16/23 2012  GLUCAP 106* 103* 118* 132*   D-Dimer No results for input(s): DDIMER in the last 72 hours. Hgb A1c Recent Labs    11/15/23 1423  HGBA1C 5.5   Lipid Profile No results for input(s): CHOL, HDL, LDLCALC, TRIG, CHOLHDL, LDLDIRECT in the last  72 hours. Thyroid function studies No results for input(s): TSH, T4TOTAL, T3FREE, THYROIDAB in the last 72 hours.  Invalid input(s): FREET3 Anemia work up No results for input(s): VITAMINB12, FOLATE, FERRITIN, TIBC, IRON, RETICCTPCT in the last 72 hours. Urinalysis    Component Value Date/Time   COLORURINE YELLOW (A) 11/15/2023 1423   APPEARANCEUR CLOUDY (A) 11/15/2023 1423   APPEARANCEUR Clear 03/20/2013 1356   LABSPEC 1.021 11/15/2023 1423   LABSPEC 1.025 03/20/2013 1356   PHURINE 5.0 11/15/2023 1423   GLUCOSEU NEGATIVE 11/15/2023 1423   GLUCOSEU Negative 03/20/2013 1356   HGBUR SMALL (A) 11/15/2023 1423   BILIRUBINUR NEGATIVE 11/15/2023 1423   BILIRUBINUR Negative 03/20/2013 1356   KETONESUR NEGATIVE 11/15/2023 1423   PROTEINUR 100 (A) 11/15/2023 1423   NITRITE POSITIVE (A) 11/15/2023 1423   LEUKOCYTESUR LARGE (A) 11/15/2023 1423   LEUKOCYTESUR Trace 03/20/2013 1356   Sepsis Labs Recent Labs  Lab 11/15/23 1423 11/16/23 0425 11/17/23 0348  WBC 15.9* 20.3* 13.3*   Microbiology Recent Results (from the past 240 hours)  Urine Culture     Status: Abnormal   Collection Time: 11/15/23  2:23 PM   Specimen: Urine, Random  Result Value Ref Range Status   Specimen Description   Final    URINE, RANDOM Performed at [facility], [address], [place], [place] [address]    Special Requests   Final    Normal Performed at [facility], [address] [place], [place] [address]    Culture >=100,000 COLONIES/mL ESCHERICHIA COLI (A)  Final   Report Status 11/17/2023 FINAL  Final   Organism ID, Bacteria ESCHERICHIA COLI (A)  Final      Susceptibility   Escherichia coli - MIC*    AMPICILLIN >=32 RESISTANT Resistant     CEFAZOLIN  (URINE) Value in next row Sensitive      4 SENSITIVEThis is a modified FDA-approved test that has been validated and its performance characteristics determined by the reporting laboratory.  This laboratory is  certified under the Clinical Laboratory Improvement Amendments CLIA as qualified to perform high complexity clinical laboratory testing.    CEFEPIME Value in next row Sensitive      4 SENSITIVEThis is a modified FDA-approved test that has been validated and its performance characteristics determined by the reporting laboratory.  This laboratory is certified under the Clinical Laboratory Improvement Amendments CLIA as qualified to perform high complexity clinical laboratory testing.    ERTAPENEM Value in next row Sensitive      4 SENSITIVEThis is a modified FDA-approved test that has been validated and its performance characteristics determined by the reporting laboratory.  This laboratory is certified under the Clinical Laboratory Improvement Amendments CLIA as qualified to perform high complexity clinical laboratory testing.    CEFTRIAXONE Value in next row Sensitive      4 SENSITIVEThis is a modified FDA-approved test that has been validated and its performance characteristics determined by the reporting laboratory.  This laboratory is certified under the Clinical Laboratory Improvement Amendments CLIA as qualified to perform high complexity clinical laboratory testing.    CIPROFLOXACIN Value in next row Sensitive      4 SENSITIVEThis is a modified FDA-approved test that has been validated and its performance characteristics determined by the reporting laboratory.  This laboratory is certified under the Clinical Laboratory Improvement Amendments CLIA as qualified to perform high complexity clinical laboratory testing.    GENTAMICIN Value in next row Sensitive      4 SENSITIVEThis is a modified FDA-approved  test that has been validated and its performance characteristics determined by the reporting laboratory.  This laboratory is certified under the Clinical Laboratory Improvement Amendments CLIA as qualified to perform high complexity clinical laboratory testing.    NITROFURANTOIN Value in next row  Sensitive      4 SENSITIVEThis is a modified FDA-approved test that has been validated and its performance characteristics determined by the reporting laboratory.  This laboratory is certified under the Clinical Laboratory Improvement Amendments CLIA as qualified to perform high complexity clinical laboratory testing.    TRIMETH/SULFA Value in next row Sensitive      4 SENSITIVEThis is a modified FDA-approved test that has been validated and its performance characteristics determined by the reporting laboratory.  This laboratory is certified under the Clinical Laboratory Improvement Amendments CLIA as qualified to perform high complexity clinical laboratory testing.    AMPICILLIN/SULBACTAM Value in next row Intermediate      4 SENSITIVEThis is a modified FDA-approved test that has been validated and its performance characteristics determined by the reporting laboratory.  This laboratory is certified under the Clinical Laboratory Improvement Amendments CLIA as qualified to perform high complexity clinical laboratory testing.    PIP/TAZO Value in next row Sensitive      <=4 SENSITIVEThis is a modified FDA-approved test that has been validated and its performance characteristics determined by the reporting laboratory.  This laboratory is certified under the Clinical Laboratory Improvement Amendments CLIA as qualified to perform high complexity clinical laboratory testing.    MEROPENEM Value in next row Sensitive      <=4 SENSITIVEThis is a modified FDA-approved test that has been validated and its performance characteristics determined by the reporting laboratory.  This laboratory is certified under the Clinical Laboratory Improvement Amendments CLIA as qualified to perform high complexity clinical laboratory testing.    * >=100,000 COLONIES/mL ESCHERICHIA COLI  Blood culture (single)     Status: None (Preliminary result)   Collection Time: 11/15/23  5:43 PM   Specimen: BLOOD  Result Value Ref Range  Status   Specimen Description BLOOD BLOOD RIGHT ARM  Final   Special Requests   Final    BOTTLES DRAWN AEROBIC AND ANAEROBIC Blood Culture results may not be optimal due to an inadequate volume of blood received in culture bottles   Culture   Final    NO GROWTH 2 DAYS Performed at [facility], [address] [place], [place] [address]    Report Status PENDING  Incomplete  MRSA Next Gen by PCR, Nasal     Status: None   Collection Time: 11/16/23 12:46 AM   Specimen: Nasal Mucosa; Nasal Swab  Result Value Ref Range Status   MRSA by PCR Next Gen NOT DETECTED NOT DETECTED Final    Comment: (NOTE) The GeneXpert MRSA Assay (FDA approved for NASAL specimens only), is one component of a comprehensive MRSA colonization surveillance program. It is not intended to diagnose MRSA infection nor to guide or monitor treatment for MRSA infections. Test performance is not FDA approved in patients less than 49 years old. Performed at [facility], [address] [place], [place] [address]    Imaging CT ABDOMEN PELVIS WO CONTRAST Result Date: 11/15/2023 CLINICAL DATA:  Bilateral flank pain, difficulty urinating and chills. EXAM: CT ABDOMEN AND PELVIS WITHOUT CONTRAST TECHNIQUE: Multidetector CT imaging of the abdomen and pelvis was performed following the standard protocol without IV contrast. RADIATION DOSE REDUCTION: This exam was performed according to the departmental dose-optimization program which includes  automated exposure control, adjustment of the mA and/or kV according to patient size and/or use of iterative reconstruction technique. COMPARISON:  CTA pelvis 11/08/2023 FINDINGS: Lower chest: No acute abnormality. Hepatobiliary: The liver demonstrates evidence of steatosis. The gallbladder is unremarkable. No evidence of hepatic mass or biliary ductal dilatation. Pancreas: Unremarkable. No pancreatic ductal dilatation or surrounding inflammatory changes. Spleen: Normal in size  without focal abnormality. Adrenals/Urinary Tract: No adrenal masses. Bilateral kidneys demonstrate no evidence of hydronephrosis, calculi or lesions. The bladder is unremarkable. Stomach/Bowel: Bowel shows no evidence of obstruction, ileus, inflammation or lesion. The appendix is normal. No free intraperitoneal air. Vascular/Lymphatic: No significant vascular findings are present. No enlarged abdominal or pelvic lymph nodes. Reproductive: Stable moderate prostatic enlargement. Other: Small left inguinal hernia containing fat. Musculoskeletal: Stable meningioma of the L1 vertebral body. IMPRESSION: 1. No acute findings in the abdomen or pelvis. 2. Hepatic steatosis. 3. Stable moderate prostatic enlargement. 4. Small left inguinal hernia containing fat. Electronically Signed   By: Marcey Moan M.D.   On: 11/15/2023 15:52      Time coordinating discharge: over 30 minutes  SIGNED:  Astor Gentle DO [facility]

## 2023-11-17 NOTE — TOC Initial Note (Signed)
 [facility]) - Initial/Assessment Note    Patient Details  Name: Keith Osborne MRN: [id number] Date of Birth: May 23, 1970  Transition of Care [facility]) CM/SW Contact:    Dalia GORMAN Fuse, RN Phone Number: 11/17/2023, 10:42 AM  Clinical Narrative:                 Brief Assessment Note  Patient admitted with flank pain and is on IV abx for UTI. Medical record reviewed and patient has no TOC needs at this time. Please outreach to [facility] if needs are identified.         Patient Goals and CMS Choice            Expected Discharge Plan and Services                                              Prior Living Arrangements/Services                       Activities of Daily Living   ADL Screening (condition at time of admission) Independently performs ADLs?: Yes (appropriate for developmental age) Is the patient deaf or have difficulty hearing?: No Does the patient have difficulty seeing, even when wearing glasses/contacts?: No Does the patient have difficulty concentrating, remembering, or making decisions?: No  Permission Sought/Granted                  Emotional Assessment              Admission diagnosis:  Acute prostatitis [N41.0] Lower urinary tract infectious disease [N39.0] Sepsis secondary to UTI (HCC) [A41.9, N39.0] Sepsis, due to unspecified organism, unspecified whether acute organ dysfunction present [facility]) [A41.9] Patient Active Problem List   Diagnosis Date Noted   Sepsis secondary to UTI (HCC) 11/15/2023   Encounter for screening colonoscopy 03/18/2022   Polyp of sigmoid colon 03/18/2022   Hx of dysplastic nevus 05/25/2016   Obstructive apnea 05/25/2011   Borderline diabetes 05/25/2011   Acid reflux 03/15/2011   Cardiac murmur 03/15/2011   Headache, migraine 03/15/2011   Calculus of kidney 03/15/2011   Adiposity 03/15/2011   Allergic rhinitis, seasonal 03/15/2011   PCP:  Stanton Lynwood FALCON, MD Pharmacy:    [id number] [address], [place] - [address] AT [facility] [address] [place] [address] Phone: [phone] Fax: [phone]     Social Drivers of Health (SDOH) Social History: SDOH Screenings   Food Insecurity: No Food Insecurity (11/16/2023)  Housing: Low Risk  (11/16/2023)  Transportation Needs: No Transportation Needs (11/16/2023)  Utilities: Not At Risk (11/16/2023)  Financial Resource Strain: Low Risk  (11/15/2022)   Received from [facility]  Tobacco Use: Low Risk  (11/15/2023)  Recent Concern: Tobacco Use - Medium Risk (11/15/2023)   Received from [facility]   SDOH Interventions:     Readmission Risk Interventions     No data to display

## 2023-11-17 NOTE — Plan of Care (Signed)
  Problem: Coping: Goal: Ability to adjust to condition or change in health will improve Outcome: Progressing   Problem: Metabolic: Goal: Ability to maintain appropriate glucose levels will improve Outcome: Progressing   Problem: Tissue Perfusion: Goal: Adequacy of tissue perfusion will improve Outcome: Progressing   Problem: Elimination: Goal: Will not experience complications related to bowel motility Outcome: Progressing   Problem: Coping: Goal: Level of anxiety will decrease Outcome: Progressing   Problem: Safety: Goal: Ability to remain free from injury will improve Outcome: Progressing   Problem: Pain Managment: Goal: General experience of comfort will improve and/or be controlled Outcome: Progressing

## 2023-11-20 LAB — CULTURE, BLOOD (SINGLE): Culture: NO GROWTH

## 2023-11-23 ENCOUNTER — Other Ambulatory Visit: Payer: Self-pay

## 2023-11-23 MED ORDER — TRULICITY 3 MG/0.5ML ~~LOC~~ SOAJ
3.0000 mg | SUBCUTANEOUS | 11 refills | Status: AC
  Filled 2023-11-23 – 2023-11-29 (×2): qty 2, 28d supply, fill #0

## 2023-11-23 MED ORDER — DOXYCYCLINE HYCLATE 100 MG PO CAPS: 100.0000 mg | ORAL_CAPSULE | Freq: Two times a day (BID) | ORAL | 2 refills | Status: DC

## 2023-11-23 MED ORDER — ESOMEPRAZOLE MAGNESIUM 40 MG PO CPDR
40.0000 mg | DELAYED_RELEASE_CAPSULE | Freq: Every day | ORAL | 3 refills | Status: AC
  Filled 2024-02-14: qty 90, 90d supply, fill #0

## 2023-11-24 ENCOUNTER — Other Ambulatory Visit: Payer: Self-pay

## 2023-11-25 ENCOUNTER — Other Ambulatory Visit: Payer: Self-pay

## 2023-11-26 ENCOUNTER — Other Ambulatory Visit: Payer: Self-pay

## 2023-11-27 ENCOUNTER — Other Ambulatory Visit: Payer: Self-pay

## 2023-11-27 MED ORDER — PROPRANOLOL HCL 40 MG PO TABS
40.0000 mg | ORAL_TABLET | Freq: Two times a day (BID) | ORAL | 3 refills | Status: AC
  Filled 2023-11-27: qty 180, 90d supply, fill #0

## 2023-11-27 MED ORDER — LISINOPRIL-HYDROCHLOROTHIAZIDE 20-25 MG PO TABS
1.0000 | ORAL_TABLET | Freq: Every day | ORAL | 3 refills | Status: AC
  Filled 2023-11-27 – 2023-12-20 (×2): qty 90, 90d supply, fill #0

## 2023-11-27 MED ORDER — ESOMEPRAZOLE MAGNESIUM 40 MG PO CPDR
40.0000 mg | DELAYED_RELEASE_CAPSULE | Freq: Every day | ORAL | 3 refills | Status: AC
  Filled 2023-11-27: qty 90, 90d supply, fill #0

## 2023-11-27 MED ORDER — TRULICITY 3 MG/0.5ML ~~LOC~~ SOAJ
3.0000 mg | SUBCUTANEOUS | 11 refills | Status: AC
  Filled 2023-11-27: qty 2, 28d supply, fill #0
  Filled 2023-12-20 – 2023-12-22 (×3): qty 2, 28d supply, fill #1
  Filled 2024-02-14: qty 6, 84d supply, fill #2

## 2023-11-29 ENCOUNTER — Other Ambulatory Visit: Payer: Self-pay

## 2023-12-11 ENCOUNTER — Other Ambulatory Visit: Payer: Self-pay

## 2023-12-20 ENCOUNTER — Other Ambulatory Visit: Payer: Self-pay

## 2023-12-21 ENCOUNTER — Other Ambulatory Visit: Payer: Self-pay

## 2023-12-22 ENCOUNTER — Other Ambulatory Visit: Payer: Self-pay

## 2024-01-03 ENCOUNTER — Other Ambulatory Visit: Payer: Self-pay

## 2024-01-03 MED ORDER — MELOXICAM 7.5 MG PO TABS
7.5000 mg | ORAL_TABLET | Freq: Every day | ORAL | 11 refills | Status: AC
  Filled 2024-01-03: qty 30, 30d supply, fill #0

## 2024-01-03 MED ORDER — TIZANIDINE HCL 2 MG PO TABS
2.0000 mg | ORAL_TABLET | Freq: Three times a day (TID) | ORAL | 11 refills | Status: AC
  Filled 2024-01-03: qty 90, 30d supply, fill #0

## 2024-01-07 ENCOUNTER — Other Ambulatory Visit (HOSPITAL_COMMUNITY): Payer: Self-pay | Admitting: Student

## 2024-01-07 DIAGNOSIS — N401 Enlarged prostate with lower urinary tract symptoms: Secondary | ICD-10-CM

## 2024-01-08 ENCOUNTER — Other Ambulatory Visit (HOSPITAL_COMMUNITY): Payer: Self-pay | Admitting: Radiology

## 2024-01-08 ENCOUNTER — Telehealth (HOSPITAL_COMMUNITY): Payer: Self-pay | Admitting: Student

## 2024-01-08 ENCOUNTER — Other Ambulatory Visit: Payer: Self-pay | Admitting: Student

## 2024-01-08 ENCOUNTER — Other Ambulatory Visit: Payer: Self-pay

## 2024-01-08 MED ORDER — PHENAZOPYRIDINE HCL 100 MG PO TABS
100.0000 mg | ORAL_TABLET | Freq: Three times a day (TID) | ORAL | 0 refills | Status: AC
  Filled 2024-01-08: qty 21, 7d supply, fill #0

## 2024-01-08 MED ORDER — CIPROFLOXACIN HCL 500 MG PO TABS
500.0000 mg | ORAL_TABLET | Freq: Two times a day (BID) | ORAL | 0 refills | Status: AC
  Filled 2024-01-08: qty 14, 7d supply, fill #0

## 2024-01-08 MED ORDER — SOLIFENACIN SUCCINATE 5 MG PO TABS
5.0000 mg | ORAL_TABLET | Freq: Every day | ORAL | 0 refills | Status: AC
  Filled 2024-01-08: qty 7, 7d supply, fill #0

## 2024-01-08 NOTE — Telephone Encounter (Signed)
 Patient scheduled 01/09/24 for prostate artery embolization with Dr. Jennefer. Post-procedure medications e-prescribed to his pharmacy. Procedure details reviewed and patient is aware to arrive to [facility] at 7 am, be NPO, have a driver, etc.   Warren Dais, AGACNP-BC 01/08/2024, 11:48 AM

## 2024-01-08 NOTE — H&P (Signed)
 Chief Complaint: Patient was seen in consultation today for benign prostatic hyperplasia.   Referring Physician(s): Herrick,Benjamin W   Supervising Physician: Jennefer Rover  Patient Status: [facility] - Out-pt  History of Present Illness: Keith Osborne is a 53 y.o. male with a medical history significant for HTN, migraines, DM, hidradenitis on immunosuppression, and benign prostatic hyperplasia with lower urinary tract symptoms. He has a history of elevated PSA with two prostate biopsies negative for malignancy. His lower urinary tract symptoms include intermittent weak stream, sense of incomplete bladder emptying, frequency and urgency. He met with his urologist and expressed an interest in pursuing treatment. Dr. Cam discussed prostate artery embolization as a potential treatment option and the patient was referred to Interventional Radiology. He met with Dr. Jennefer 10/30/23 and was found to be an excellent candidate for prostate artery embolization. They reviewed the procedure details, risks, benefits, alternatives and procedural expectations. The patient was in agreement to proceed and a CTA of the pelvis was obtained for procedure planning.   He was hospitalized for a few days in early October for sepsis caused by UTI and urinary retention. He required in/out catheterization. He hasn't had further issues of urinary retention since then. He [facility] to [facility] Interventional Radiology department for prostate artery embolization.   Past Medical History:  Diagnosis Date   Acid reflux    Arthritis    Diabetes mellitus without complication (HCC)    Hx of dysplastic nevus 05/25/2016   R top of shoulder   Hypertension    Migraines    Neuromuscular disorder (HCC)    Psoriasis    Sleep apnea    uses CPAP    Past Surgical History:  Procedure Laterality Date   COLONOSCOPY WITH PROPOFOL  N/A 03/18/2022   Procedure: COLONOSCOPY WITH PROPOFOL ;  Surgeon: Jinny Carmine, MD;   Location: [facility];  Service: Endoscopy;  Laterality: N/A;   FOOT SURGERY Left    IR RADIOLOGIST EVAL & MGMT  10/30/2023   NERVE REPAIR Right 03/07/2018   Procedure: Baxter's Release;  Surgeon: Ashley Soulier, DPM;  Location: [facility];  Service: Podiatry;  Laterality: Right;   PLANTAR FASCIA RELEASE Right 03/07/2018   Procedure: Endoscopic Plantar Fasc. Release Right;  Surgeon: Ashley Soulier, DPM;  Location: [facility];  Service: Podiatry;  Laterality: Right;  general with local   POLYPECTOMY  03/18/2022   Procedure: POLYPECTOMY;  Surgeon: Jinny Carmine, MD;  Location: [facility];  Service: Endoscopy;;   TONSILLECTOMY     53 y.o.   WISDOM TOOTH EXTRACTION      Allergies: Cyclobenzaprine  Medications: Prior to Admission medications   Medication Sig Start Date End Date Taking? Authorizing Provider  aspirin  EC 81 MG tablet Take by mouth daily.    [provider]  aspirin -acetaminophen -caffeine (MIGRAINE FORMULA) 250-250-65 MG per tablet As directed by package.    [provider]  cetirizine-pseudoephedrine (ZYRTEC-D) 5-120 MG per tablet Take by mouth.    [provider]  ciprofloxacin  (CIPRO ) 500 MG tablet Take 1 tablet (500 mg total) by mouth 2 (two) times daily for 7 days. 01/08/24 01/15/24  Tonette Warren SAUNDERS, NP  COSENTYX UNOREADY 300 MG/2ML SOAJ Inject 300 mg into the skin every 30 (thirty) days. 06/28/23   [provider]  Dulaglutide  (TRULICITY ) 3 MG/0.5ML SOAJ Inject 3 mg into the skin once a week. 09/13/23     Dulaglutide  (TRULICITY ) 3 MG/0.5ML SOAJ Inject 3 mg into the skin every 7 (seven) days. 11/27/23  esomeprazole  (NEXIUM ) 40 MG capsule Take 40 mg by mouth daily at 12 noon.    [provider]  esomeprazole  (NEXIUM ) 40 MG capsule Take 1 capsule (40 mg total) by mouth daily. 11/20/23     esomeprazole  (NEXIUM ) 40 MG capsule Take 1 capsule (40 mg total) by mouth daily. 11/27/23     FIBER PO Take 1  tablet by mouth daily.    [provider]  finasteride  (PROSCAR ) 5 MG tablet Take 5 mg by mouth daily.    [provider]  lisinopril -hydrochlorothiazide  (ZESTORETIC ) 20-25 MG tablet Take 1 tablet by mouth once daily 11/27/23     MEGARED OMEGA-3 KRILL OIL 500 MG CAPS Take by mouth.    [provider]  meloxicam  (MOBIC ) 15 MG tablet Take 1 tablet (15 mg total) by mouth daily. Patient not taking: Reported on 11/15/2023 08/16/23   Silva Juliene SAUNDERS, DPM  meloxicam  (MOBIC ) 7.5 MG tablet Take 1 tablet (7.5 mg total) by mouth daily. 01/03/24     phenazopyridine  (PYRIDIUM ) 100 MG tablet Take 1 tablet (100 mg total) by mouth in the morning, at noon, and at bedtime for 7 days. 01/08/24 01/15/24  Bela Bonaparte R, NP  propranolol  (INDERAL ) 40 MG tablet Take 0.5 tablets (20 mg total) by mouth 2 (two) times daily. PLEASE NOTE REDUCED DOSE DUE TO LOW BLOOD PRESSURE. INCREASE BACK TO 40 MG TWICE DAILY ONLY WHEN DIRECTED BY A DOCTOR/PA/NP 11/17/23   Alexander, Natalie, DO  propranolol  (INDERAL ) 40 MG tablet Take 1 tablet (40 mg total) by mouth 2 (two) times daily 11/27/23     senna (SENOKOT) 8.6 MG tablet Take 3 tablets by mouth daily.    [provider]  solifenacin  (VESICARE ) 5 MG tablet Take 1 tablet (5 mg total) by mouth daily for 7 days. 01/08/24 01/15/24  Coletta Lockner R, NP  tamsulosin  (FLOMAX ) 0.4 MG CAPS capsule Take 1 capsule (0.4 mg total) by mouth daily. 01/21/16   Harlee Lynwood LABOR, MD  tiZANidine  (ZANAFLEX ) 2 MG tablet Take 1 tablet (2 mg total) by mouth 3 (three) times daily. 01/03/24     TRULICITY  3 MG/0.5ML SOAJ Inject 3 mg into the skin every 7 (seven) days.    [provider]     Family History  Problem Relation Age of Onset   Breast cancer Maternal Aunt     Social History   Socioeconomic History   Marital status: Married    Spouse name: Not on file   Number of children: Not on file   Years of education: Not on file   Highest education level:  Not on file  Occupational History   Not on file  Tobacco Use   Smoking status: Never    Passive exposure: Never   Smokeless tobacco: Never  Vaping Use   Vaping status: Never Used  Substance and Sexual Activity   Alcohol use: Yes    Comment: occ.   Drug use: No   Sexual activity: Not on file  Other Topics Concern   Not on file  Social History Narrative   Not on file   Social Drivers of Health   Financial Resource Strain: Low Risk  (11/23/2023)   Received from [facility]   Overall Financial Resource Strain (CARDIA)    Difficulty of Paying Living Expenses: Not hard at all  Food Insecurity: No Food Insecurity (11/23/2023)   Received from [facility]   Hunger Vital Sign    Within the past 12 months, you  worried that your food would run out before you got the money to buy more.: Never true    Within the past 12 months, the food you bought just didn't last and you didn't have money to get more.: Never true  Transportation Needs: No Transportation Needs (11/23/2023)   Received from [facility] - Transportation    In the past 12 months, has lack of transportation kept you from medical appointments or from getting medications?: No    Lack of Transportation (Non-Medical): No  Physical Activity: Not on file  Stress: Not on file  Social Connections: Not on file    Review of Systems: A 12 point ROS discussed and pertinent positives are indicated in the HPI above.  All other systems are negative.  Review of Systems  Vital Signs: There were no vitals taken for this visit.  Physical Exam  Imaging: No results found.  Labs:  CBC: Recent Labs    11/15/23 1423 11/16/23 0425 11/17/23 0348  WBC 15.9* 20.3* 13.3*  HGB 14.8 11.6* 11.9*  HCT 44.0 33.8* 35.3*  PLT 262 196 213    COAGS: No results for input(s): INR, APTT in the last 8760 hours.  BMP: Recent Labs    11/15/23 1423 11/16/23 0425 11/17/23 0348   NA 135 134* 138  K 3.7 3.2* 3.6  CL 98 102 105  CO2 24 24 23   GLUCOSE 118* 115* 110*  BUN 21* 27* 17  CALCIUM 9.3 8.0* 8.5*  CREATININE 0.89 1.05 0.78  GFRNONAA >60 >60 >60    LIVER FUNCTION TESTS: Recent Labs    11/15/23 1423  BILITOT 0.8  AST 36  ALT 51*  ALKPHOS 68  PROT 7.9  ALBUMIN 4.3    TUMOR MARKERS: No results for input(s): AFPTM, CEA, CA199, CHROMGRNA in the last 8760 hours.  Assessment and Plan:  Benign prostatic hyperplasia with lower urinary tract symptoms: Keith Osborne, 53 year old male, presents today for an image-guided prostate artery embolization.   Risks and benefits of this procedure were discussed with the patient including, but not limited to bleeding, infection, vascular injury or contrast induced renal failure.  All of the patient's questions were answered, patient is agreeable to proceed. He has been NPO.   Consent signed and in chart.  Thank you for this interesting consult.  I greatly enjoyed meeting Keith Osborne and look forward to participating in their care.  A copy of this report was sent to the requesting provider on this date.  Electronically Signed: Warren Dais, AGACNP-BC 01/08/2024, 12:00 PM   I spent a total of  30 Minutes   in face to face in clinical consultation, greater than 50% of which was counseling/coordinating care for benign prostatic hyperplasia.

## 2024-01-09 ENCOUNTER — Other Ambulatory Visit (HOSPITAL_COMMUNITY): Payer: Self-pay | Admitting: Interventional Radiology

## 2024-01-09 ENCOUNTER — Other Ambulatory Visit: Payer: Self-pay

## 2024-01-09 ENCOUNTER — Ambulatory Visit (HOSPITAL_COMMUNITY)
Admission: RE | Admit: 2024-01-09 | Discharge: 2024-01-09 | Disposition: A | Discharge status: Home / Self Care | Source: Ambulatory Visit | Attending: Interventional Radiology | Admitting: Interventional Radiology

## 2024-01-09 DIAGNOSIS — R3915 Urgency of urination: Secondary | ICD-10-CM | POA: Insufficient documentation

## 2024-01-09 DIAGNOSIS — R3912 Poor urinary stream: Secondary | ICD-10-CM | POA: Insufficient documentation

## 2024-01-09 DIAGNOSIS — N401 Enlarged prostate with lower urinary tract symptoms: Secondary | ICD-10-CM

## 2024-01-09 DIAGNOSIS — R339 Retention of urine, unspecified: Secondary | ICD-10-CM | POA: Insufficient documentation

## 2024-01-09 HISTORY — PX: IR US GUIDE VASC ACCESS LEFT: IMG2389

## 2024-01-09 HISTORY — PX: IR ANGIOGRAM SELECTIVE EACH ADDITIONAL VESSEL: IMG667

## 2024-01-09 HISTORY — PX: IR ANGIOGRAM PELVIS SELECTIVE OR SUPRASELECTIVE: IMG661

## 2024-01-09 HISTORY — PX: IR EMBO ARTERIAL NOT HEMORR HEMANG INC GUIDE ROADMAPPING: IMG5448

## 2024-01-09 HISTORY — PX: IR US GUIDE VASC ACCESS RIGHT: IMG2390

## 2024-01-09 LAB — BASIC METABOLIC PANEL WITH GFR
Anion gap: 10 (ref 5–15)
BUN: 21 mg/dL — ABNORMAL HIGH (ref 6–20)
CO2: 24 mmol/L (ref 22–32)
Calcium: 9 mg/dL (ref 8.9–10.3)
Chloride: 103 mmol/L (ref 98–111)
Creatinine, Ser: 0.77 mg/dL (ref 0.61–1.24)
GFR, Estimated: >60 mL/min (ref >60)
Glucose, Bld: 91 mg/dL (ref 70–99)
Potassium: 4.1 mmol/L (ref 3.5–5.1)
Sodium: 137 mmol/L (ref 135–145)

## 2024-01-09 LAB — CBC
HCT: 42.7 % (ref 39.0–52.0)
Hemoglobin: 14.4 g/dL (ref 13.0–17.0)
MCH: 29.5 pg (ref 26.0–34.0)
MCHC: 33.7 g/dL (ref 30.0–36.0)
MCV: 87.5 fL (ref 80.0–100.0)
Platelets: 276 K/uL (ref 150–400)
RBC: 4.88 MIL/uL (ref 4.22–5.81)
RDW: 12.3 % (ref 11.5–15.5)
WBC: 7.2 K/uL (ref 4.0–10.5)
nRBC: 0 % (ref 0.0–0.2)

## 2024-01-09 LAB — GLUCOSE, CAPILLARY
Glucose-Capillary: 108 mg/dL — ABNORMAL HIGH (ref 70–99)
Glucose-Capillary: 104 mg/dL — ABNORMAL HIGH (ref 70–99)

## 2024-01-09 LAB — PROTIME-INR
INR: 1 (ref 0.8–1.2)
Prothrombin Time: 13.5 s (ref 11.4–15.2)

## 2024-01-09 MED ORDER — LIDOCAINE-EPINEPHRINE 1 %-1:100000 IJ SOLN
INTRAMUSCULAR | Status: AC
  Filled 2024-01-09: qty 1

## 2024-01-09 MED ORDER — HEPARIN SODIUM (PORCINE) 1000 UNIT/ML IJ SOLN
INTRAMUSCULAR | Status: AC
  Filled 2024-01-09: qty 10

## 2024-01-09 MED ORDER — VERAPAMIL HCL 2.5 MG/ML IV SOLN: INTRA_ARTERIAL | Status: AC | PRN

## 2024-01-09 MED ORDER — SODIUM CHLORIDE 0.9 % IV SOLN: INTRAVENOUS | Status: DC

## 2024-01-09 MED ORDER — CHLORHEXIDINE GLUCONATE CLOTH 2 % EX PADS: 6.0000 | MEDICATED_PAD | Freq: Every day | CUTANEOUS | Status: DC

## 2024-01-09 MED ORDER — CIPROFLOXACIN IN D5W 400 MG/200ML IV SOLN: 400.0000 mg | INTRAVENOUS | Status: AC

## 2024-01-09 MED ORDER — IODIXANOL 320 MG/ML IV SOLN
100.0000 mL | Freq: Once | INTRAVENOUS | Status: AC | PRN
  Administered 2024-01-09: 45 mL via INTRAVENOUS

## 2024-01-09 MED ORDER — LIDOCAINE-EPINEPHRINE 1 %-1:100000 IJ SOLN
INTRAMUSCULAR | Status: AC
Start: 2024-01-09 — End: 2024-01-09
  Filled 2024-01-09: qty 1

## 2024-01-09 MED ORDER — MIDAZOLAM HCL 2 MG/2ML IJ SOLN
INTRAMUSCULAR | Status: AC
  Filled 2024-01-09: qty 4

## 2024-01-09 MED ORDER — NITROGLYCERIN 1 MG/10 ML FOR IR/CATH LAB
INTRA_ARTERIAL | Status: AC
  Filled 2024-01-09: qty 10

## 2024-01-09 MED ORDER — LIDOCAINE-PRILOCAINE 2.5-2.5 % EX CREA
TOPICAL_CREAM | Freq: Once | CUTANEOUS | Status: AC
  Filled 2024-01-09: qty 5

## 2024-01-09 MED ORDER — FENTANYL CITRATE (PF) 100 MCG/2ML IJ SOLN
INTRAMUSCULAR | Status: AC | PRN
Start: 2024-01-09 — End: 2024-01-09
  Administered 2024-01-09 (×2): 25 ug via INTRAVENOUS

## 2024-01-09 MED ORDER — FENTANYL CITRATE (PF) 100 MCG/2ML IJ SOLN
INTRAMUSCULAR | Status: AC
  Filled 2024-01-09: qty 4

## 2024-01-09 MED ORDER — SODIUM CHLORIDE 0.9 % IV SOLN
INTRAVENOUS | Status: AC | PRN
  Administered 2024-01-09: 500 mL via INTRAVENOUS
  Administered 2024-01-09: 250 mL via INTRAVENOUS

## 2024-01-09 MED ORDER — LIDOCAINE HCL URETHRAL/MUCOSAL 2 % EX GEL
1.0000 | Freq: Once | CUTANEOUS | Status: AC
  Administered 2024-01-09: 1 via URETHRAL
  Filled 2024-01-09: qty 6

## 2024-01-09 MED ORDER — VERAPAMIL HCL 2.5 MG/ML IV SOLN
INTRAVENOUS | Status: AC
Start: 2024-01-09 — End: 2024-01-09
  Filled 2024-01-09: qty 2

## 2024-01-09 MED ORDER — MIDAZOLAM HCL (PF) 2 MG/2ML IJ SOLN
INTRAMUSCULAR | Status: AC | PRN
Start: 2024-01-09 — End: 2024-01-09
  Administered 2024-01-09 (×4): .5 mg via INTRAVENOUS

## 2024-01-09 MED ORDER — CIPROFLOXACIN IN D5W 400 MG/200ML IV SOLN
INTRAVENOUS | Status: AC
  Administered 2024-01-09: 400 mg via INTRAVENOUS
  Filled 2024-01-09: qty 200

## 2024-01-09 MED ORDER — PREDNISONE 20 MG PO TABS
20.0000 mg | ORAL_TABLET | Freq: Once | ORAL | Status: AC
  Administered 2024-01-09: 20 mg via ORAL
  Filled 2024-01-09: qty 1

## 2024-01-09 MED ORDER — SODIUM CHLORIDE (PF) 0.9 % IJ SOLN
INTRAVENOUS | Status: AC | PRN
  Administered 2024-01-09: 100 ug via INTRA_ARTERIAL

## 2024-01-09 MED ORDER — NITROGLYCERIN 2 % TD OINT
1.0000 [in_us] | TOPICAL_OINTMENT | Freq: Once | TRANSDERMAL | Status: AC
  Administered 2024-01-09: 1 [in_us] via TOPICAL
  Filled 2024-01-09: qty 1

## 2024-01-09 NOTE — Procedures (Signed)
 Interventional Radiology Procedure Note  Procedure: Right prostate artery embolization   Findings: Please refer to procedural dictation for full description. Left radial artery 5 Fr access.  Tortuous, spasmodic left prostatic artery, unable to cannulate. 12 cc in TR band at 11:40.  Complications: None immediate  Estimated Blood Loss: < 5 mL  Recommendations: Foley out. TR band release protocol. 1 month IR follow up.   Ester Sides, MD

## 2024-01-17 ENCOUNTER — Other Ambulatory Visit: Payer: Self-pay

## 2024-01-17 MED ORDER — LISINOPRIL-HYDROCHLOROTHIAZIDE 20-25 MG PO TABS
1.0000 | ORAL_TABLET | Freq: Every day | ORAL | 3 refills | Status: AC
  Filled 2024-01-17: qty 90, 90d supply, fill #0

## 2024-01-17 MED ORDER — TRULICITY 3 MG/0.5ML ~~LOC~~ SOAJ
3.0000 mg | SUBCUTANEOUS | 11 refills | Status: AC
  Filled 2024-01-17: qty 2, 28d supply, fill #0

## 2024-02-05 ENCOUNTER — Other Ambulatory Visit: Payer: Self-pay

## 2024-02-05 MED ORDER — TAMSULOSIN HCL 0.4 MG PO CAPS
0.4000 mg | ORAL_CAPSULE | Freq: Every day | ORAL | 2 refills | Status: AC
  Filled 2024-02-05: qty 90, 90d supply, fill #0

## 2024-02-14 ENCOUNTER — Other Ambulatory Visit: Payer: Self-pay

## 2024-02-14 ENCOUNTER — Other Ambulatory Visit: Payer: Self-pay | Admitting: Interventional Radiology

## 2024-02-14 DIAGNOSIS — N401 Enlarged prostate with lower urinary tract symptoms: Secondary | ICD-10-CM

## 2024-02-27 NOTE — Progress Notes (Signed)
 "  Referring Physician(s): Herrick,Benjamin W   Chief Complaint: The patient is seen in follow up today s/p prostate artery embolization 01/09/24  History of present illness: HPI from initial consultation 10/30/23 Keith Osborne is a 54 y.o. male with a medical history significant for HTN, migraines, DM and benign prostatic hyperplasia with lower urinary tract symptoms. He has a history of elevated PSA with two prostate biopsies negative for malignancy. The patient struggles intermittently with weak streak and sense of incomplete bladder emptying. He takes Tamsulosin  and finasteride  with some improvement in his symptoms.    At his last Urology visit he complained of worsening frequency and urgency. He is ready to pursue treatment and Dr. Cam discussed prostate artery embolization and reviewed the procedure details. The patient expressed interest and he has been kindly referred to Interventional Radiology for further discussion.    His main complaints are incomplete emptying and urgency.  He has never had an acute episode of urinary retention.  No UTIs or hematuria.   We discussed the rationale, periprocedural expectations, and long term expected outcomes of prostate artery embolization. He was agreeable to proceed and a CTA pelvis was obtained 11/08/23 for procedure planning.   On 01/09/24 he presented to the [facility] IR department and underwent a technically successful right prostatic artery embolization. Due to tortuous, diminutive anatomy as well as vasospasm, the left prostatic artery was unable to be embolized. He tolerated the procedure well and he presents to the IR clinic today for follow up.  IPSS-QoL is down from 18-5 pre procedure to 7-2 today.  He is very pleased with the results. He has experienced post-micturation dribbling over the past several weeks which is new for him.  No hematuria.  He is able to get through the day at work much easier now that his urinary frequency is  decreased.  He has follow up with Dr. Cam next month.  Past Medical History:  Diagnosis Date   Acid reflux    Arthritis    Diabetes mellitus without complication (HCC)    Hx of dysplastic nevus 05/25/2016   R top of shoulder   Hypertension    Migraines    Neuromuscular disorder (HCC)    Psoriasis    Sleep apnea    uses CPAP    Past Surgical History:  Procedure Laterality Date   COLONOSCOPY WITH PROPOFOL  N/A 03/18/2022   Procedure: COLONOSCOPY WITH PROPOFOL ;  Surgeon: Jinny Carmine, MD;  Location: [facility];  Service: Endoscopy;  Laterality: N/A;   FOOT SURGERY Left    IR ANGIOGRAM PELVIS SELECTIVE OR SUPRASELECTIVE  01/09/2024   IR ANGIOGRAM PELVIS SELECTIVE OR SUPRASELECTIVE  01/09/2024   IR ANGIOGRAM SELECTIVE EACH ADDITIONAL VESSEL  01/09/2024   IR EMBO ARTERIAL NOT HEMORR HEMANG INC GUIDE ROADMAPPING  01/09/2024   IR RADIOLOGIST EVAL & MGMT  10/30/2023   IR US  GUIDE VASC ACCESS LEFT  01/09/2024   IR US  GUIDE VASC ACCESS RIGHT  01/09/2024   NERVE REPAIR Right 03/07/2018   Procedure: Baxter's Release;  Surgeon: Ashley Soulier, DPM;  Location: [facility];  Service: Podiatry;  Laterality: Right;   PLANTAR FASCIA RELEASE Right 03/07/2018   Procedure: Endoscopic Plantar Fasc. Release Right;  Surgeon: Ashley Soulier, DPM;  Location: [facility];  Service: Podiatry;  Laterality: Right;  general with local   POLYPECTOMY  03/18/2022   Procedure: POLYPECTOMY;  Surgeon: Jinny Carmine, MD;  Location: [facility];  Service: Endoscopy;;   TONSILLECTOMY  54 y.o.   WISDOM TOOTH EXTRACTION      Allergies: Cyclobenzaprine  Medications: Prior to Admission medications  Medication Sig Start Date End Date Taking? Authorizing Provider  aspirin  EC 81 MG tablet Take by mouth daily.    [provider]  aspirin -acetaminophen -caffeine (MIGRAINE FORMULA) 250-250-65 MG per tablet As directed by package.    [provider]   cetirizine-pseudoephedrine (ZYRTEC-D) 5-120 MG per tablet Take by mouth.    [provider]  Dulaglutide  (TRULICITY ) 3 MG/0.5ML SOAJ Inject 3 mg into the skin once a week. 09/13/23     Dulaglutide  (TRULICITY ) 3 MG/0.5ML SOAJ Inject 3 mg into the skin every 7 (seven) days. 11/27/23     Dulaglutide  (TRULICITY ) 3 MG/0.5ML SOAJ Inject 3 mg into the skin every 7 (seven) days. 01/17/24     esomeprazole  (NEXIUM ) 40 MG capsule Take 40 mg by mouth daily at 12 noon.    [provider]  esomeprazole  (NEXIUM ) 40 MG capsule Take 1 capsule (40 mg total) by mouth daily. 11/20/23     esomeprazole  (NEXIUM ) 40 MG capsule Take 1 capsule (40 mg total) by mouth daily. 11/27/23     FIBER PO Take 1 tablet by mouth daily.    [provider]  finasteride  (PROSCAR ) 5 MG tablet Take 5 mg by mouth daily.    [provider]  lisinopril -hydrochlorothiazide  (ZESTORETIC ) 20-25 MG tablet Take 1 tablet by mouth once daily 11/27/23     lisinopril -hydrochlorothiazide  (ZESTORETIC ) 20-25 MG tablet Take 1 tablet by mouth daily. 01/17/24     MEGARED OMEGA-3 KRILL OIL 500 MG CAPS Take by mouth.    [provider]  meloxicam  (MOBIC ) 15 MG tablet Take 1 tablet (15 mg total) by mouth daily. 08/16/23   McDonald, Juliene SAUNDERS, DPM  meloxicam  (MOBIC ) 7.5 MG tablet Take 1 tablet (7.5 mg total) by mouth daily. 01/03/24     propranolol  (INDERAL ) 40 MG tablet Take 0.5 tablets (20 mg total) by mouth 2 (two) times daily. PLEASE NOTE REDUCED DOSE DUE TO LOW BLOOD PRESSURE. INCREASE BACK TO 40 MG TWICE DAILY ONLY WHEN DIRECTED BY A DOCTOR/PA/NP 11/17/23   Alexander, Natalie, DO  propranolol  (INDERAL ) 40 MG tablet Take 1 tablet (40 mg total) by mouth 2 (two) times daily 11/27/23     senna (SENOKOT) 8.6 MG tablet Take 3 tablets by mouth daily.    [provider]  tamsulosin  (FLOMAX ) 0.4 MG CAPS capsule Take 1 capsule (0.4 mg total) by mouth daily. 01/21/16   Harlee Lynwood LABOR, MD  tamsulosin  (FLOMAX ) 0.4 MG CAPS  capsule Take 1 capsule (0.4 mg total) by mouth daily. 02/01/24   Cam Morene ORN, MD  tiZANidine  (ZANAFLEX ) 2 MG tablet Take 1 tablet (2 mg total) by mouth 3 (three) times daily. 01/03/24     TRULICITY  3 MG/0.5ML SOAJ Inject 3 mg into the skin every 7 (seven) days.    [provider]     Family History  Problem Relation Age of Onset   Breast cancer Maternal Aunt     Social History   Socioeconomic History   Marital status: Married    Spouse name: Not on file   Number of children: Not on file   Years of education: Not on file   Highest education level: Not on file  Occupational History   Not on file  Tobacco Use   Smoking status: Never    Passive exposure: Never   Smokeless tobacco: Never  Vaping Use   Vaping status: Never Used  Substance and Sexual Activity   Alcohol use: Yes    Comment: occ.   Drug use: No   Sexual activity: Not on file  Other Topics Concern   Not on file  Social History Narrative   Not on file   Social Drivers of Health   Tobacco Use: Medium Risk (01/03/2024)   Received from [facility]   Patient History    Smoking Tobacco Use: Never    Smokeless Tobacco Use: Never    Passive Exposure: Current  Financial Resource Strain: Low Risk  (11/23/2023)   Received from [facility]   Overall Financial Resource Strain (CARDIA)    Difficulty of Paying Living Expenses: Not hard at all  Food Insecurity: No Food Insecurity (11/23/2023)   Received from [facility]   Epic    Within the past 12 months, you worried that your food would run out before you got the money to buy more.: Never true    Within the past 12 months, the food you bought just didn't last and you didn't have money to get more.: Never true  Transportation Needs: No Transportation Needs (11/23/2023)   Received from [facility] - Transportation    In the past 12 months, has lack of transportation kept you  from medical appointments or from getting medications?: No    Lack of Transportation (Non-Medical): No  Physical Activity: Not on file  Stress: Not on file  Social Connections: Not on file  Depression (EYV7-0): Not on file  Alcohol Screen: Not on file  Housing: Low Risk  (11/23/2023)   Received from [facility]   Epic    In the last 12 months, was there a time when you were not able to pay the mortgage or rent on time?: No    In the past 12 months, how many times have you moved where you were living?: 0    At any time in the past 12 months, were you homeless or living in a shelter (including now)?: No  Utilities: Not At Risk (11/23/2023)   Received from [facility]   Epic    In the past 12 months has the electric, gas, oil, or water  company threatened to shut off services in your home?: No  Health Literacy: Not on file     Vital Signs: There were no vitals taken for this visit.  Physical Exam Constitutional:      General: He is not in acute distress. HENT:     Head: Normocephalic.     Mouth/Throat:     Mouth: Mucous membranes are moist.  Eyes:     General: No scleral icterus. Cardiovascular:     Rate and Rhythm: Normal rate and regular rhythm.  Pulmonary:     Effort: No respiratory distress.  Abdominal:     General: There is no distension.  Musculoskeletal:     Right lower leg: No edema.     Left lower leg: No edema.  Skin:    General: Skin is warm and dry.  Neurological:     Mental Status: He is alert and oriented to person, place, and time.     Imaging:  MR Prostate 04/12/22    Prostate volume 95.96 cc (6.3 by 5.2 by 6.0 cm)  PAE 01/09/24   Labs:  CBC: Recent Labs    11/15/23 1423 11/16/23 0425 11/17/23 0348 01/09/24 0736  WBC 15.9* 20.3* 13.3* 7.2  HGB  14.8 11.6* 11.9* 14.4  HCT 44.0 33.8* 35.3* 42.7  PLT 262 196 213 276    COAGS: Recent Labs    01/09/24 0736  INR 1.0    BMP: Recent Labs     11/15/23 1423 11/16/23 0425 11/17/23 0348 01/09/24 0736  NA 135 134* 138 137  K 3.7 3.2* 3.6 4.1  CL 98 102 105 103  CO2 24 24 23 24   GLUCOSE 118* 115* 110* 91  BUN 21* 27* 17 21*  CALCIUM 9.3 8.0* 8.5* 9.0  CREATININE 0.89 1.05 0.78 0.77  GFRNONAA >60 >60 >60 >60    LIVER FUNCTION TESTS: Recent Labs    11/15/23 1423  BILITOT 0.8  AST 36  ALT 51*  ALKPHOS 68  PROT 7.9  ALBUMIN 4.3    Assessment and Plan: 54 year old male with a history of benign prostatic hyperplasia (96 g) with moderate lower urinary tract symptoms (IPSS-QoL 18-5). He underwent a technically successful right (difficult cannulation --> spasm of the left) prostatic artery embolization 01/09/24 and IPSS-QoL today is 7-2.  He is very pleased with the results.  No indication for repeat attempt at left PAE at this time.  Follow up with Interventional Radiology as needed.  Ester Sides, MD Pager: [phone]    I spent a total of 25 Minutes in face to face in clinical consultation, greater than 50% of which was counseling/coordinating care for benign prostatic hyperplasia.      "

## 2024-02-28 ENCOUNTER — Inpatient Hospital Stay
Admission: RE | Admit: 2024-02-28 | Discharge: 2024-02-28 | Disposition: A | Source: Ambulatory Visit | Attending: Interventional Radiology

## 2024-02-28 DIAGNOSIS — N401 Enlarged prostate with lower urinary tract symptoms: Secondary | ICD-10-CM

## 2024-02-28 HISTORY — PX: IR RADIOLOGIST EVAL & MGMT: IMG5224

## 2024-03-12 ENCOUNTER — Other Ambulatory Visit: Payer: Self-pay

## 2024-03-13 ENCOUNTER — Other Ambulatory Visit: Payer: Self-pay

## 2024-03-13 MED ORDER — FINASTERIDE 5 MG PO TABS
5.0000 mg | ORAL_TABLET | Freq: Every day | ORAL | 3 refills | Status: AC
  Filled 2024-03-13: qty 90, 90d supply, fill #0

## 2024-03-14 ENCOUNTER — Other Ambulatory Visit: Payer: Self-pay
